# Patient Record
Sex: Female | Born: 1940 | Race: White | Hispanic: No | Marital: Married | Smoking: Never smoker
Health system: Southern US, Community
[De-identification: ages and names within clinical notes are randomized; demographics above are authoritative.]

## PROBLEM LIST (undated history)

## (undated) DIAGNOSIS — Z860100 Personal history of colon polyps, unspecified: Secondary | ICD-10-CM

## (undated) DIAGNOSIS — F0390 Unspecified dementia without behavioral disturbance: Secondary | ICD-10-CM

## (undated) DIAGNOSIS — K219 Gastro-esophageal reflux disease without esophagitis: Secondary | ICD-10-CM

## (undated) DIAGNOSIS — C541 Malignant neoplasm of endometrium: Secondary | ICD-10-CM

## (undated) DIAGNOSIS — Z889 Allergy status to unspecified drugs, medicaments and biological substances status: Secondary | ICD-10-CM

## (undated) DIAGNOSIS — F419 Anxiety disorder, unspecified: Secondary | ICD-10-CM

## (undated) DIAGNOSIS — F32A Depression, unspecified: Secondary | ICD-10-CM

## (undated) HISTORY — PX: ABDOMINAL HYSTERECTOMY: SHX81

---

## 2001-03-12 HISTORY — PX: COLONOSCOPY: SHX174

## 2005-09-22 ENCOUNTER — Other Ambulatory Visit: Payer: Self-pay

## 2005-09-22 ENCOUNTER — Ambulatory Visit: Payer: Self-pay | Admitting: Urology

## 2007-02-01 ENCOUNTER — Ambulatory Visit: Payer: Self-pay | Admitting: Family Medicine

## 2008-11-22 ENCOUNTER — Emergency Department: Payer: Self-pay | Admitting: Emergency Medicine

## 2008-12-24 ENCOUNTER — Ambulatory Visit: Payer: Self-pay | Admitting: Family Medicine

## 2010-03-01 ENCOUNTER — Ambulatory Visit: Payer: Self-pay | Admitting: Surgery

## 2010-03-08 ENCOUNTER — Ambulatory Visit: Payer: Self-pay | Admitting: Surgery

## 2010-10-21 ENCOUNTER — Ambulatory Visit: Payer: Self-pay | Admitting: Family Medicine

## 2010-11-25 ENCOUNTER — Ambulatory Visit: Payer: Self-pay | Admitting: Family Medicine

## 2011-04-12 ENCOUNTER — Ambulatory Visit: Payer: Self-pay | Admitting: Unknown Physician Specialty

## 2011-04-12 HISTORY — PX: COLONOSCOPY: SHX174

## 2011-04-12 HISTORY — PX: ESOPHAGOGASTRODUODENOSCOPY: SHX1529

## 2011-05-05 ENCOUNTER — Ambulatory Visit: Payer: Self-pay | Admitting: Family Medicine

## 2011-05-13 HISTORY — PX: COLONOSCOPY: SHX174

## 2011-10-25 ENCOUNTER — Ambulatory Visit: Payer: Self-pay | Admitting: Family Medicine

## 2011-11-09 HISTORY — PX: COLONOSCOPY: SHX174

## 2012-03-09 ENCOUNTER — Ambulatory Visit: Payer: Self-pay | Admitting: Internal Medicine

## 2012-09-11 ENCOUNTER — Ambulatory Visit: Payer: Self-pay | Admitting: Otolaryngology

## 2012-09-11 LAB — CREATININE, SERUM
Creatinine: 0.83 mg/dL (ref 0.60–1.30)
EGFR (Non-African Amer.): >60

## 2012-10-25 ENCOUNTER — Ambulatory Visit: Payer: Self-pay | Admitting: Internal Medicine

## 2013-10-28 ENCOUNTER — Ambulatory Visit: Payer: Self-pay | Admitting: Internal Medicine

## 2013-11-20 ENCOUNTER — Ambulatory Visit: Payer: Self-pay | Admitting: Ophthalmology

## 2014-01-01 ENCOUNTER — Ambulatory Visit: Payer: Self-pay | Admitting: Ophthalmology

## 2014-05-28 ENCOUNTER — Encounter: Payer: Self-pay | Admitting: Podiatry

## 2014-05-28 ENCOUNTER — Ambulatory Visit (INDEPENDENT_AMBULATORY_CARE_PROVIDER_SITE_OTHER): Payer: Medicare Other

## 2014-05-28 ENCOUNTER — Ambulatory Visit (INDEPENDENT_AMBULATORY_CARE_PROVIDER_SITE_OTHER): Payer: Medicare Other | Admitting: Podiatry

## 2014-05-28 VITALS — BP 122/75 | HR 72 | Resp 16 | Ht 60.0 in | Wt 128.0 lb

## 2014-05-28 DIAGNOSIS — G629 Polyneuropathy, unspecified: Secondary | ICD-10-CM

## 2014-05-28 DIAGNOSIS — D519 Vitamin B12 deficiency anemia, unspecified: Secondary | ICD-10-CM

## 2014-05-28 NOTE — Progress Notes (Signed)
   Subjective:    Patient ID: Laurie Long, female    DOB: 12-03-1940, 74 y.o.   MRN: [id number]  HPI Comments: Both of my feet into my knees burn and sting. My right instep has fallen. My 2nd  toe on my left foot cramps so bad. It will lay over my big toe when its cramping. This all started 6 months ago. Its gotten worse. It hurts when i walk. i soak in epsom salt, i sit them on a heating pad  Foot Pain Associated symptoms include a sore throat.      Review of Systems  HENT: Positive for sore throat and trouble swallowing.        Sinus problems   Cardiovascular: Positive for leg swelling.       Calf pain  Gastrointestinal: Positive for constipation.  Musculoskeletal: Positive for back pain.       Joint pain Difficulty walking  Neurological: Positive for dizziness and light-headedness.  All other systems reviewed and are negative.      Objective:   Physical Exam: I have reviewed her past medical history medications allergies surgery social history and review of systems. Ulcers are strongly palpable bilateral. Neurologic sensorium is intact per Semmes-Weinstein monofilament. Deep tendon reflexes are intact bilaterally muscle strength +5 over 5 dorsiflexion plantar flexors and inverters everters all intrinsic musculature is intact orthopedic evaluation demonstrates all joints distal to the ankle for range of motion without crepitation.        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  Plan: Increase B-12 and follow-up with me in 1 month I did put her on Nuerx and we will reevaluate her at that time and perform blood work if necessary.

## 2014-06-25 ENCOUNTER — Ambulatory Visit (INDEPENDENT_AMBULATORY_CARE_PROVIDER_SITE_OTHER): Payer: Medicare Other | Admitting: Podiatry

## 2014-06-25 ENCOUNTER — Encounter: Payer: Self-pay | Admitting: Podiatry

## 2014-06-25 DIAGNOSIS — G629 Polyneuropathy, unspecified: Secondary | ICD-10-CM | POA: Diagnosis not present

## 2014-06-25 DIAGNOSIS — D519 Vitamin B12 deficiency anemia, unspecified: Secondary | ICD-10-CM

## 2014-06-25 NOTE — Progress Notes (Signed)
She presents today states that she is approximately 98% improved. She's been taking neurax and states that it has helped considerably. She no longer has pain radiating from her feet through her legs and knees.  Objective: Pulses are palpable otherwise no change in physical exam other than no neuropathic findings.  Assessment: Neuropathy being treated with neurax.  Plan: Continue with his oral medication. Follow up with me as needed

## 2014-11-18 ENCOUNTER — Other Ambulatory Visit: Payer: Self-pay | Admitting: Internal Medicine

## 2014-11-18 DIAGNOSIS — Z1231 Encounter for screening mammogram for malignant neoplasm of breast: Secondary | ICD-10-CM

## 2014-11-19 ENCOUNTER — Ambulatory Visit
Admission: RE | Admit: 2014-11-19 | Discharge: 2014-11-19 | Disposition: A | Discharge status: Home / Self Care | Payer: Medicare Other | Source: Ambulatory Visit | Attending: Internal Medicine | Admitting: Internal Medicine

## 2014-11-19 ENCOUNTER — Other Ambulatory Visit: Payer: Self-pay | Admitting: Internal Medicine

## 2014-11-19 DIAGNOSIS — Z1231 Encounter for screening mammogram for malignant neoplasm of breast: Secondary | ICD-10-CM

## 2014-12-23 ENCOUNTER — Encounter: Payer: Self-pay | Admitting: *Deleted

## 2014-12-24 ENCOUNTER — Ambulatory Visit: Payer: Medicare Other | Admitting: Anesthesiology

## 2014-12-24 ENCOUNTER — Encounter: Payer: Self-pay | Admitting: *Deleted

## 2014-12-24 ENCOUNTER — Encounter
Admission: RE | Disposition: A | Discharge status: Home / Self Care | Payer: Self-pay | Source: Ambulatory Visit | Attending: Unknown Physician Specialty

## 2014-12-24 ENCOUNTER — Ambulatory Visit
Admission: RE | Admit: 2014-12-24 | Discharge: 2014-12-24 | Disposition: A | Discharge status: Home / Self Care | Payer: Medicare Other | Source: Ambulatory Visit | Attending: Unknown Physician Specialty | Admitting: Unknown Physician Specialty

## 2014-12-24 DIAGNOSIS — K621 Rectal polyp: Secondary | ICD-10-CM | POA: Insufficient documentation

## 2014-12-24 DIAGNOSIS — D122 Benign neoplasm of ascending colon: Secondary | ICD-10-CM | POA: Insufficient documentation

## 2014-12-24 DIAGNOSIS — D123 Benign neoplasm of transverse colon: Secondary | ICD-10-CM | POA: Insufficient documentation

## 2014-12-24 DIAGNOSIS — Z7982 Long term (current) use of aspirin: Secondary | ICD-10-CM | POA: Diagnosis not present

## 2014-12-24 DIAGNOSIS — Z79899 Other long term (current) drug therapy: Secondary | ICD-10-CM | POA: Diagnosis not present

## 2014-12-24 DIAGNOSIS — F419 Anxiety disorder, unspecified: Secondary | ICD-10-CM | POA: Insufficient documentation

## 2014-12-24 DIAGNOSIS — Z885 Allergy status to narcotic agent status: Secondary | ICD-10-CM | POA: Insufficient documentation

## 2014-12-24 DIAGNOSIS — K219 Gastro-esophageal reflux disease without esophagitis: Secondary | ICD-10-CM | POA: Insufficient documentation

## 2014-12-24 DIAGNOSIS — Z86010 Personal history of colonic polyps: Secondary | ICD-10-CM | POA: Diagnosis present

## 2014-12-24 DIAGNOSIS — R42 Dizziness and giddiness: Secondary | ICD-10-CM | POA: Diagnosis not present

## 2014-12-24 HISTORY — PX: COLONOSCOPY WITH PROPOFOL: SHX5780

## 2014-12-24 HISTORY — DX: Personal history of colon polyps, unspecified: Z86.0100

## 2014-12-24 HISTORY — DX: Gastro-esophageal reflux disease without esophagitis: K21.9

## 2014-12-24 HISTORY — DX: Personal history of colonic polyps: Z86.010

## 2014-12-24 HISTORY — DX: Anxiety disorder, unspecified: F41.9

## 2014-12-24 SURGERY — COLONOSCOPY WITH PROPOFOL
Anesthesia: General

## 2014-12-24 MED ORDER — PROPOFOL INFUSION 10 MG/ML OPTIME
INTRAVENOUS | Status: DC | PRN
  Administered 2014-12-24: 100 ug/kg/min via INTRAVENOUS

## 2014-12-24 MED ORDER — PROPOFOL 10 MG/ML IV BOLUS
INTRAVENOUS | Status: DC | PRN
  Administered 2014-12-24: 30 mg via INTRAVENOUS

## 2014-12-24 MED ORDER — SODIUM CHLORIDE 0.9 % IV SOLN: INTRAVENOUS | Status: DC

## 2014-12-24 MED ORDER — SODIUM CHLORIDE 0.9 % IV SOLN
INTRAVENOUS | Status: DC
  Administered 2014-12-24: 1000 mL via INTRAVENOUS
  Administered 2014-12-24: 08:00:00 via INTRAVENOUS

## 2014-12-24 NOTE — Anesthesia Preprocedure Evaluation (Addendum)
Anesthesia Evaluation  Patient identified by MRN, date of birth, ID band Patient awake    Reviewed: Allergy & Precautions, NPO status , Patient's Chart, lab work & pertinent test results  Airway Mallampati: II  TM Distance: >3 FB     Dental  (+) Chipped   Pulmonary neg pulmonary ROS,    Pulmonary exam normal       Cardiovascular negative cardio ROS Normal cardiovascular exam    Neuro/Psych Anxiety negative neurological ROS     GI/Hepatic Neg liver ROS, GERD-  Medicated and Controlled,Colon polyps   Endo/Other  negative endocrine ROS  Renal/GU negative Renal ROS  negative genitourinary   Musculoskeletal negative musculoskeletal ROS (+)   Abdominal Normal abdominal exam  (+)   Peds negative pediatric ROS (+)  Hematology negative hematology ROS (+)   Anesthesia Other Findings   Reproductive/Obstetrics                            Anesthesia Physical Anesthesia Plan  ASA: II  Anesthesia Plan: General   Post-op Pain Management:    Induction: Intravenous  Airway Management Planned: Nasal Cannula  Additional Equipment:   Intra-op Plan:   Post-operative Plan:   Informed Consent: I have reviewed the patients History and Physical, chart, labs and discussed the procedure including the risks, benefits and alternatives for the proposed anesthesia with the patient or authorized representative who has indicated his/her understanding and acceptance.   Dental advisory given  Plan Discussed with: CRNA and Surgeon  Anesthesia Plan Comments:         Anesthesia Quick Evaluation

## 2014-12-24 NOTE — Anesthesia Postprocedure Evaluation (Signed)
  Anesthesia Post-op Note  Patient: Laurie Long  Procedure(s) Performed: Procedure(s): COLONOSCOPY WITH PROPOFOL (N/A)  Anesthesia type:General  Patient location: PACU  Post pain: Pain level controlled  Post assessment: Post-op Vital signs reviewed, Patient's Cardiovascular Status Stable, Respiratory Function Stable, Patent Airway and No signs of Nausea or vomiting  Post vital signs: Reviewed and stable  Last Vitals:  Filed Vitals:   12/24/14 0906  BP: 131/69  Pulse: 58  Temp:   Resp: 15    Level of consciousness: awake, alert  and patient cooperative  Complications: No apparent anesthesia complications

## 2014-12-24 NOTE — H&P (Signed)
Primary Care Physician:  Tracie Harrier, MD Primary Gastroenterologist:  Dr. Vira Agar  Pre-Procedure History & Physical: HPI:  Laurie Long is a 74 y.o. female is here for an colonoscopy.   Past Medical History  Diagnosis Date  . Anxiety   . GERD (gastroesophageal reflux disease)   . History of colonic polyps     Past Surgical History  Procedure Laterality Date  . Abdominal hysterectomy      Prior to Admission medications   Medication Sig Start Date End Date Taking? Authorizing Provider  ALPRAZolam Duanne Moron) 0.5 MG tablet Take 0.5 mg by mouth at bedtime as needed for anxiety.   Yes Historical Provider, MD  gabapentin (NEURONTIN) 100 MG capsule Take 100 mg by mouth at bedtime.   Yes Historical Provider, MD  levocetirizine (XYZAL) 5 MG tablet Take 5 mg by mouth every evening.   Yes Historical Provider, MD  meclizine (ANTIVERT) 25 MG tablet Take 25 mg by mouth 3 (three) times daily as needed for dizziness.   Yes Historical Provider, MD  Ranitidine HCl (ZANTAC PO) Take by mouth daily.   Yes Historical Provider, MD  vitamin B-12 (CYANOCOBALAMIN) 1000 MCG tablet Take 1,000 mcg by mouth daily.   Yes Historical Provider, MD  aspirin 81 MG tablet Take 81 mg by mouth daily.    Historical Provider, MD    Allergies as of 10/29/2014 - Review Complete 06/25/2014  Allergen Reaction Noted  . Codeine Other (See Comments) 05/28/2014    History reviewed. No pertinent family history.  Social History   Social History  . Marital Status: Married    Spouse Name: N/A  . Number of Children: N/A  . Years of Education: N/A   Occupational History  . Not on file.   Social History Main Topics  . Smoking status: Never Smoker   . Smokeless tobacco: Not on file  . Alcohol Use: No  . Drug Use: No  . Sexual Activity: Not on file   Other Topics Concern  . Not on file   Social History Narrative    Review of Systems: See HPI, otherwise negative ROS  Physical Exam: Pulse 60  Temp(Src)  98.6 F (37 C) (Oral)  Resp 20  Ht 5\' 1"  (1.549 m)  Wt 54.432 kg (120 lb)  BMI 22.69 kg/m2  SpO2 100% General:   Alert,  pleasant and cooperative in NAD Head:  Normocephalic and atraumatic. Neck:  Supple; no masses or thyromegaly. Lungs:  Clear throughout to auscultation.    Heart:  Regular rate and rhythm. Abdomen:  Soft, nontender and nondistended. Normal bowel sounds, without guarding, and without rebound.   Neurologic:  Alert and  oriented x4;  grossly normal neurologically.  Impression/Plan: Laurie Long is here for an colonoscopy to be performed for [facility] colon polyps  Risks, benefits, limitations, and alternatives regarding  colonoscopy have been reviewed with the patient.  Questions have been answered.  All parties agreeable.   Gaylyn Cheers, MD  12/24/2014, 8:01 AM    Primary Care Physician:  Tracie Harrier, MD Primary Gastroenterologist:  Dr. Vira Agar  Pre-Procedure History & Physical: HPI:  Laurie Long is a 74 y.o. female is here for an colonoscopy.   Past Medical History  Diagnosis Date  . Anxiety   . GERD (gastroesophageal reflux disease)   . History of colonic polyps     Past Surgical History  Procedure Laterality Date  . Abdominal hysterectomy      Prior to Admission medications   Medication Sig Start Date  End Date Taking? Authorizing Provider  ALPRAZolam Duanne Moron) 0.5 MG tablet Take 0.5 mg by mouth at bedtime as needed for anxiety.   Yes Historical Provider, MD  gabapentin (NEURONTIN) 100 MG capsule Take 100 mg by mouth at bedtime.   Yes Historical Provider, MD  levocetirizine (XYZAL) 5 MG tablet Take 5 mg by mouth every evening.   Yes Historical Provider, MD  meclizine (ANTIVERT) 25 MG tablet Take 25 mg by mouth 3 (three) times daily as needed for dizziness.   Yes Historical Provider, MD  Ranitidine HCl (ZANTAC PO) Take by mouth daily.   Yes Historical Provider, MD  vitamin B-12 (CYANOCOBALAMIN) 1000 MCG tablet Take 1,000 mcg by mouth daily.    Yes Historical Provider, MD  aspirin 81 MG tablet Take 81 mg by mouth daily.    Historical Provider, MD    Allergies as of 10/29/2014 - Review Complete 06/25/2014  Allergen Reaction Noted  . Codeine Other (See Comments) 05/28/2014    History reviewed. No pertinent family history.  Social History   Social History  . Marital Status: Married    Spouse Name: N/A  . Number of Children: N/A  . Years of Education: N/A   Occupational History  . Not on file.   Social History Main Topics  . Smoking status: Never Smoker   . Smokeless tobacco: Not on file  . Alcohol Use: No  . Drug Use: No  . Sexual Activity: Not on file   Other Topics Concern  . Not on file   Social History Narrative    Review of Systems: See HPI, otherwise negative ROS  Physical Exam: Pulse 60  Temp(Src) 98.6 F (37 C) (Oral)  Resp 20  Ht 5\' 1"  (1.549 m)  Wt 54.432 kg (120 lb)  BMI 22.69 kg/m2  SpO2 100% General:   Alert,  pleasant and cooperative in NAD Head:  Normocephalic and atraumatic. Neck:  Supple; no masses or thyromegaly. Lungs:  Clear throughout to auscultation.    Heart:  Regular rate and rhythm. Abdomen:  Soft, nontender and nondistended. Normal bowel sounds, without guarding, and without rebound.   Neurologic:  Alert and  oriented x4;  grossly normal neurologically.  Impression/Plan: Laurie Long is here for an colonoscopy to be performed for [facility] colon polyps  Risks, benefits, limitations, and alternatives regarding  colonoscopy have been reviewed with the patient.  Questions have been answered.  All parties agreeable.   Gaylyn Cheers, MD  12/24/2014, 8:01 AM

## 2014-12-24 NOTE — Transfer of Care (Signed)
Immediate Anesthesia Transfer of Care Note  Patient: Laurie Long  Procedure(s) Performed: Procedure(s): COLONOSCOPY WITH PROPOFOL (N/A)  Patient Location: PACU  Anesthesia Type:General  Level of Consciousness: sedated  Airway & Oxygen Therapy: Patient Spontanous Breathing  Post-op Assessment: Report given to RN and Post -op Vital signs reviewed and stable  Post vital signs: Reviewed and stable  Last Vitals:  Filed Vitals:   12/24/14 0724  Pulse: 60  Temp: 37 C  Resp: 20    Complications: No apparent anesthesia complications

## 2014-12-25 ENCOUNTER — Encounter: Payer: Self-pay | Admitting: Unknown Physician Specialty

## 2014-12-25 LAB — SURGICAL PATHOLOGY

## 2014-12-25 NOTE — Op Note (Signed)
[facility] Gastroenterology Patient Name: Laurie Long Procedure Date: 12/24/2014 7:28 AM MRN: [id number] Account #: [id number] Date of Birth: 04-22-1941 Admit Type: Outpatient Age: 74 Room: [facility] ENDO ROOM 3 Gender: Female Note Status: Finalized Procedure:         Colonoscopy Indications:       High risk colon cancer surveillance: Personal history of                     colonic polyps Providers:         Manya Silvas, MD Referring MD:      Tracie Harrier, MD (Referring MD) Medicines:         Propofol per Anesthesia Complications:     No immediate complications. Procedure:         Pre-Anesthesia Assessment:                    - After reviewing the risks and benefits, the patient was                     deemed in satisfactory condition to undergo the procedure.                    After obtaining informed consent, the colonoscope was                     passed under direct vision. Throughout the procedure, the                     patient's blood pressure, pulse, and oxygen saturations                     were monitored continuously. The Colonoscope was                     introduced through the anus and advanced to the the cecum,                     identified by appendiceal orifice and ileocecal valve. The                     colonoscopy was performed without difficulty. The patient                     tolerated the procedure well. The quality of the bowel                     preparation was excellent. Findings:      A diminutive polyp was found in the transverse colon. The polyp was       sessile. The polyp was removed with a jumbo cold forceps. Resection and       retrieval were complete.      A diminutive polyp was found in the ascending colon. The polyp was       sessile. The polyp was removed with a jumbo cold forceps. Resection and       retrieval were complete.      A diminutive polyp was found in the rectum. The polyp was sessile. The       polyp  was removed with a jumbo cold forceps. Resection and retrieval       were complete.      Clips from previous hemorrhoid surgery seen.      The exam was otherwise without abnormality. Impression:        -  One diminutive polyp in the transverse colon. Resected                     and retrieved.                    - One diminutive polyp in the ascending colon. Resected                     and retrieved.                    - One diminutive polyp in the rectum. Resected and                     retrieved. Recommendation:    - Await pathology results. Manya Silvas, MD 12/24/2014 8:32:40 AM This report has been signed electronically. Number of Addenda: 0 Note Initiated On: 12/24/2014 7:28 AM Scope Withdrawal Time: 0 hours 11 minutes 5 seconds  Total Procedure Duration: 0 hours 18 minutes 5 seconds       [facility]

## 2015-06-22 ENCOUNTER — Other Ambulatory Visit: Payer: Self-pay | Admitting: Internal Medicine

## 2015-06-22 DIAGNOSIS — Z1231 Encounter for screening mammogram for malignant neoplasm of breast: Secondary | ICD-10-CM

## 2015-06-22 DIAGNOSIS — R1011 Right upper quadrant pain: Secondary | ICD-10-CM

## 2015-06-29 ENCOUNTER — Ambulatory Visit
Admission: RE | Admit: 2015-06-29 | Discharge: 2015-06-29 | Disposition: A | Discharge status: Home / Self Care | Payer: Medicare Other | Source: Ambulatory Visit | Attending: Internal Medicine | Admitting: Internal Medicine

## 2015-06-29 DIAGNOSIS — R1011 Right upper quadrant pain: Secondary | ICD-10-CM

## 2015-07-06 ENCOUNTER — Other Ambulatory Visit: Payer: Self-pay | Admitting: Internal Medicine

## 2015-07-06 DIAGNOSIS — R1011 Right upper quadrant pain: Secondary | ICD-10-CM

## 2015-07-15 ENCOUNTER — Ambulatory Visit
Admission: RE | Admit: 2015-07-15 | Discharge: 2015-07-15 | Disposition: A | Discharge status: Home / Self Care | Payer: Medicare Other | Source: Ambulatory Visit | Attending: Internal Medicine | Admitting: Internal Medicine

## 2015-07-15 DIAGNOSIS — R932 Abnormal findings on diagnostic imaging of liver and biliary tract: Secondary | ICD-10-CM | POA: Diagnosis not present

## 2015-07-15 DIAGNOSIS — R1011 Right upper quadrant pain: Secondary | ICD-10-CM | POA: Diagnosis present

## 2015-07-15 DIAGNOSIS — R197 Diarrhea, unspecified: Secondary | ICD-10-CM | POA: Insufficient documentation

## 2015-07-15 MED ORDER — IOHEXOL 300 MG/ML  SOLN
100.0000 mL | Freq: Once | INTRAMUSCULAR | Status: AC | PRN
  Administered 2015-07-15: 100 mL via INTRAVENOUS

## 2015-07-16 ENCOUNTER — Other Ambulatory Visit: Payer: Self-pay | Admitting: Physician Assistant

## 2015-07-16 ENCOUNTER — Ambulatory Visit
Admission: RE | Admit: 2015-07-16 | Discharge: 2015-07-16 | Disposition: A | Discharge status: Home / Self Care | Payer: Medicare Other | Source: Ambulatory Visit | Attending: Physician Assistant | Admitting: Physician Assistant

## 2015-07-16 DIAGNOSIS — M7989 Other specified soft tissue disorders: Secondary | ICD-10-CM

## 2015-07-16 DIAGNOSIS — M79604 Pain in right leg: Secondary | ICD-10-CM | POA: Insufficient documentation

## 2015-11-20 ENCOUNTER — Other Ambulatory Visit: Payer: Self-pay | Admitting: Internal Medicine

## 2015-11-20 ENCOUNTER — Ambulatory Visit
Admission: RE | Admit: 2015-11-20 | Discharge: 2015-11-20 | Disposition: A | Discharge status: Home / Self Care | Payer: Medicare Other | Source: Ambulatory Visit | Attending: Internal Medicine | Admitting: Internal Medicine

## 2015-11-20 DIAGNOSIS — Z1231 Encounter for screening mammogram for malignant neoplasm of breast: Secondary | ICD-10-CM

## 2016-10-12 ENCOUNTER — Other Ambulatory Visit: Payer: Self-pay | Admitting: Internal Medicine

## 2016-10-12 DIAGNOSIS — Z1231 Encounter for screening mammogram for malignant neoplasm of breast: Secondary | ICD-10-CM

## 2016-11-22 ENCOUNTER — Ambulatory Visit
Admission: RE | Admit: 2016-11-22 | Discharge: 2016-11-22 | Disposition: A | Discharge status: Home / Self Care | Payer: Medicare Other | Source: Ambulatory Visit | Attending: Internal Medicine | Admitting: Internal Medicine

## 2016-11-22 DIAGNOSIS — Z1231 Encounter for screening mammogram for malignant neoplasm of breast: Secondary | ICD-10-CM | POA: Insufficient documentation

## 2016-11-24 IMAGING — CT CT ABD-PELV W/ CM
1 of 3 series · 14 of 32 positions shown, 19 images · IV contrast (omnipaque)
Comparison: Right upper quadrant ultrasound 06/29/2015. CT abdomen
and pelvis 09/22/2005.

CLINICAL DATA: Right upper quadrant abdominal pain with diarrhea
for 1 month.

EXAM:
CT ABDOMEN AND PELVIS WITH CONTRAST
TECHNIQUE: Multidetector CT imaging of the abdomen and pelvis was performed
using the standard protocol following bolus administration of
intravenous contrast.
CONTRAST:  100mL OMNIPAQUE IOHEXOL 300 MG/ML  SOLN

[Series 2: routine abd pel with · axial · 0.69mm/px · z∈[-470,-70]mm · 14 of 91 slices shown, 19 images]
[im 6/91  soft-tissue]
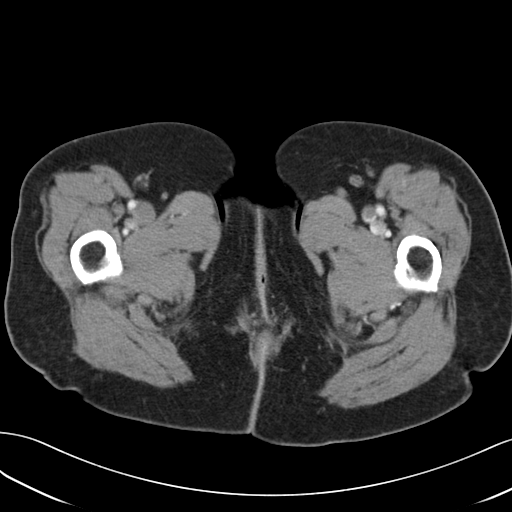
[im 6/91  bone]
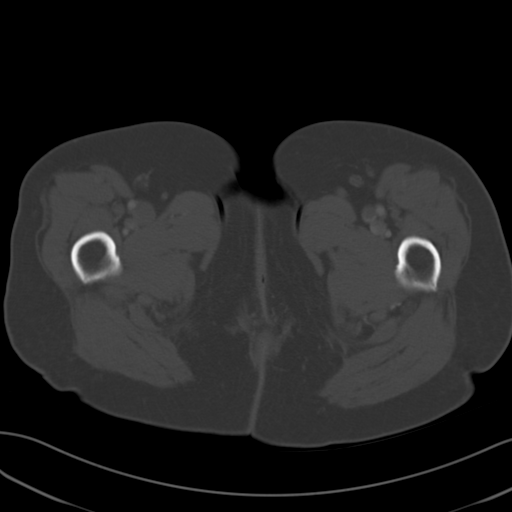
[im 11/91  soft-tissue]
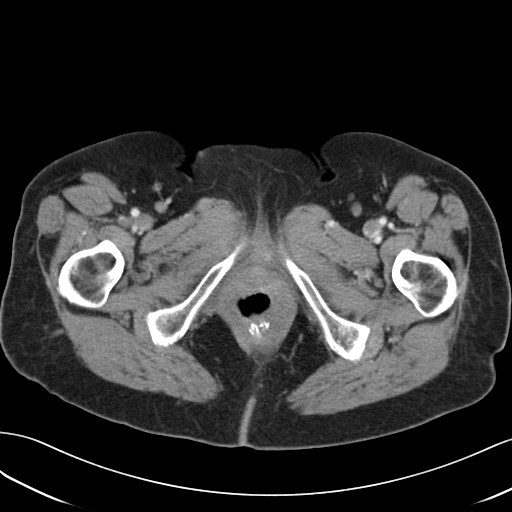
[im 21/91  soft-tissue]
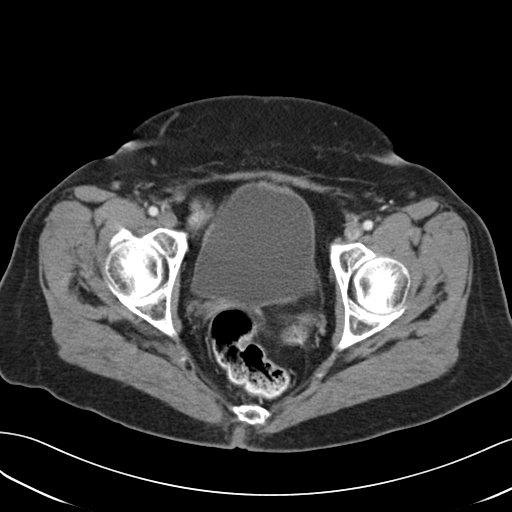
[im 26/91  soft-tissue]
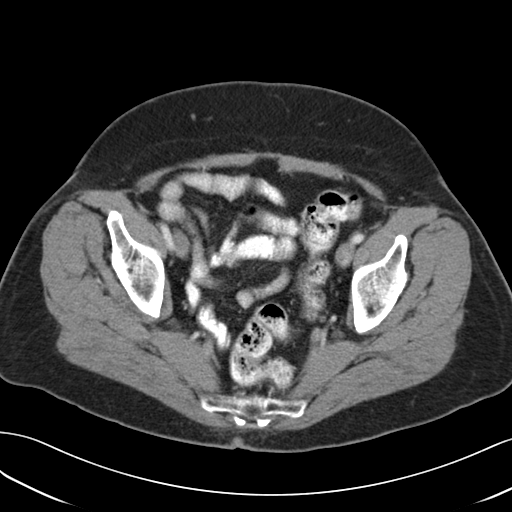
[im 31/91  soft-tissue]
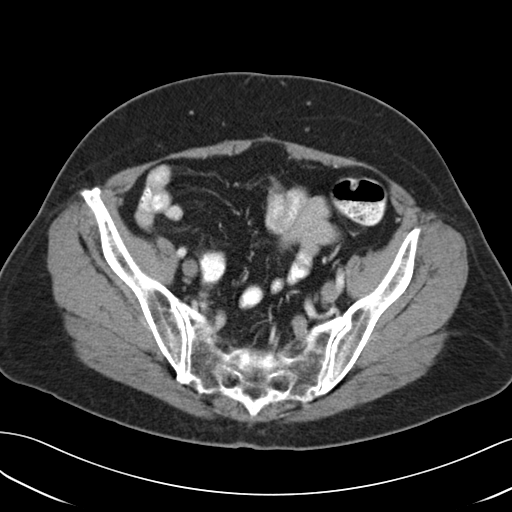
[im 41/91  soft-tissue]
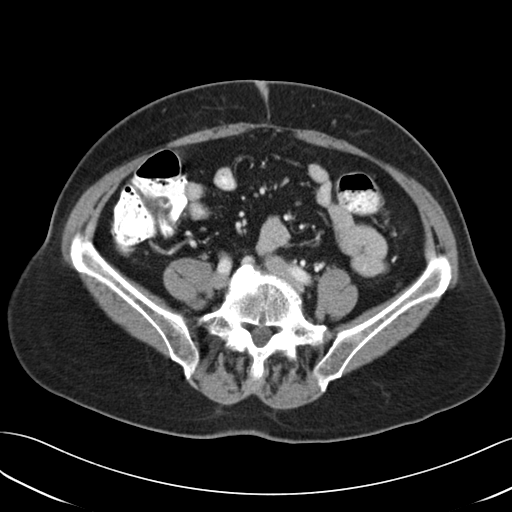
[im 46/91  soft-tissue]
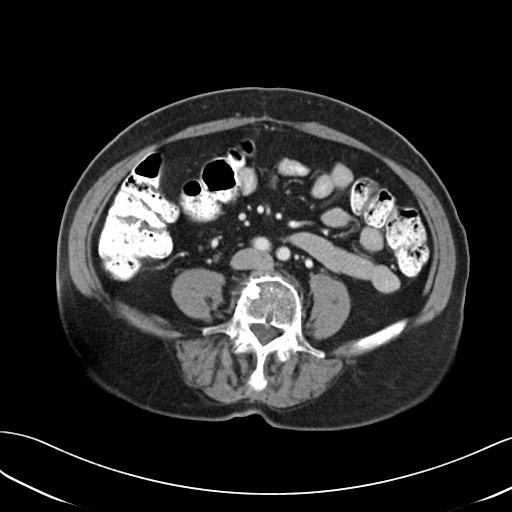
[im 51/91  soft-tissue]
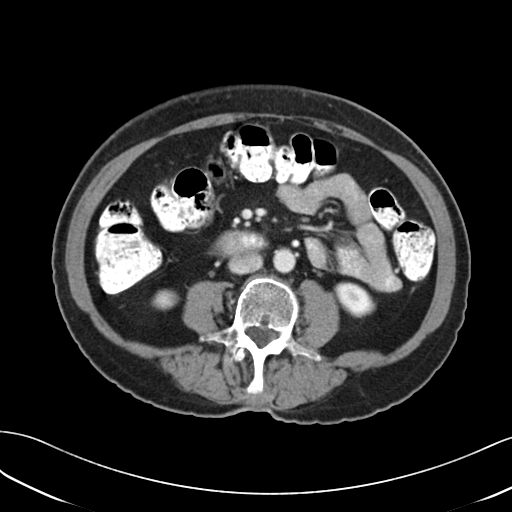
[im 61/91  soft-tissue]
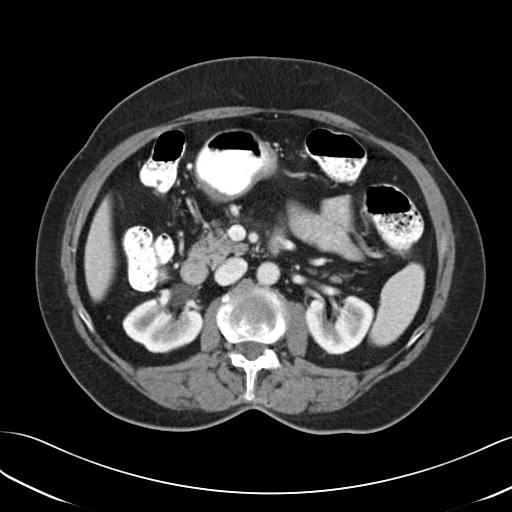
[im 61/91  bone]
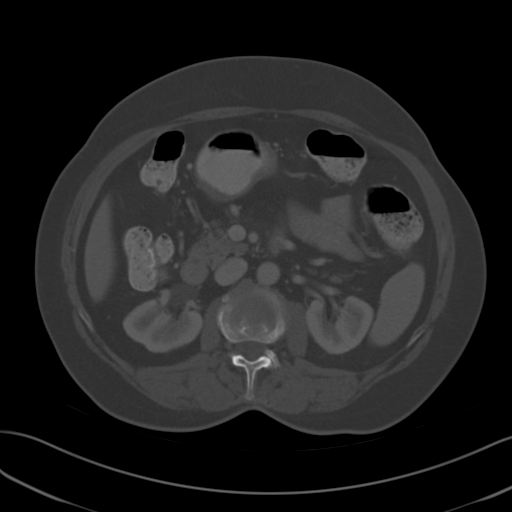
[im 66/91  soft-tissue]
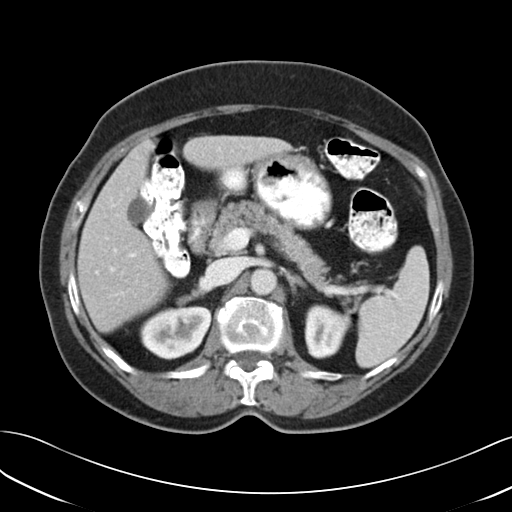
[im 71/91  soft-tissue]
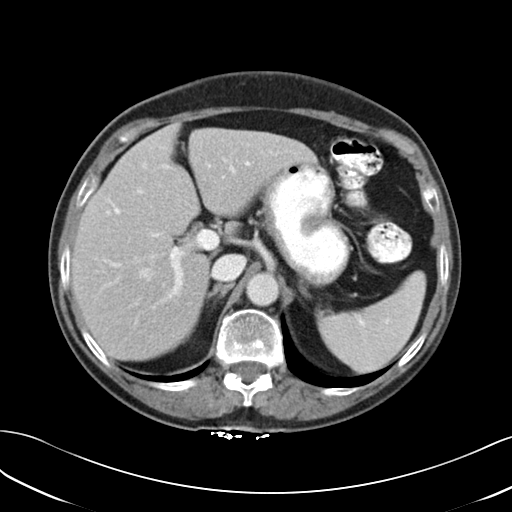
[im 71/91  lung]
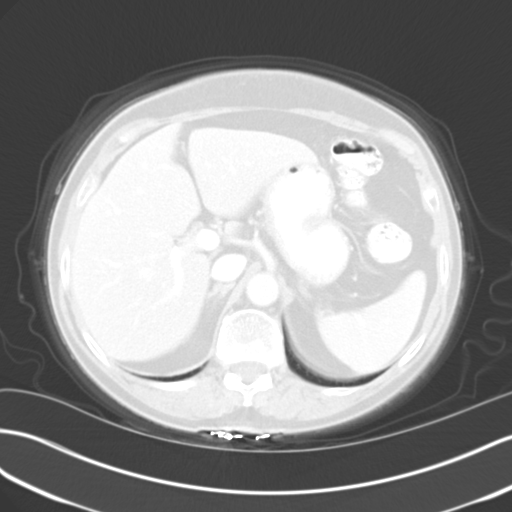
[im 76/91  lung]
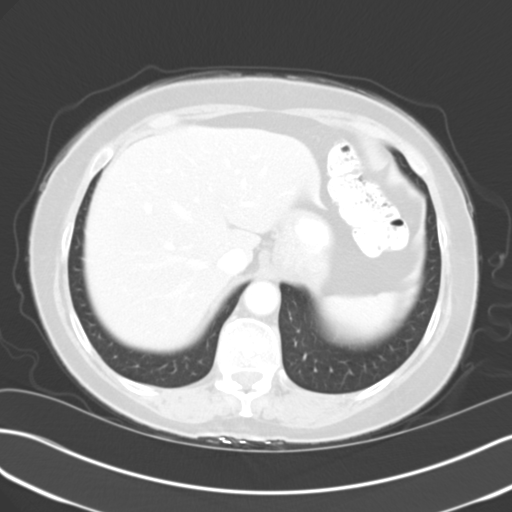
[im 81/91  soft-tissue]
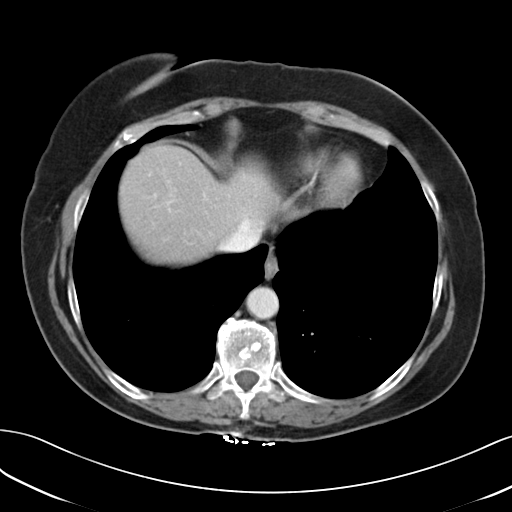
[im 81/91  lung]
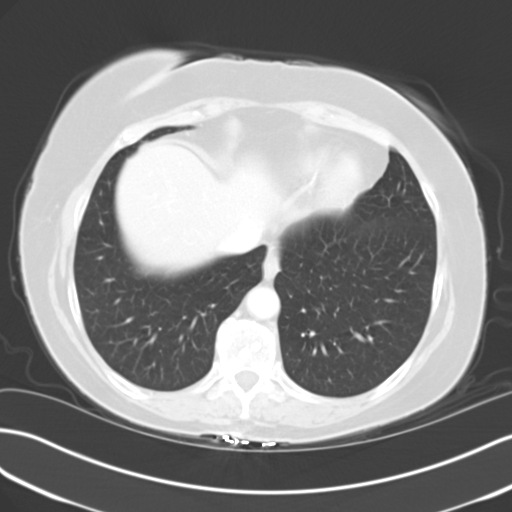
[im 86/91  soft-tissue]
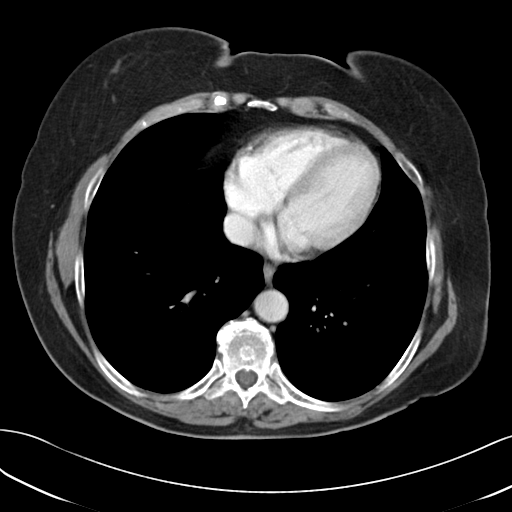
[im 86/91  lung]
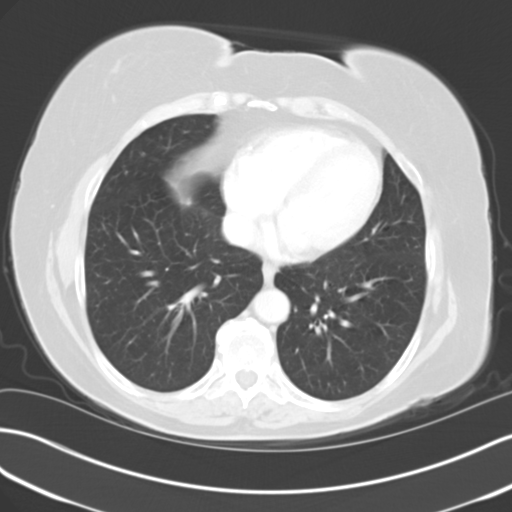

[14 of 32 positions shown; findings below may reference images not displayed]

FINDINGS: 2-3 mm left lower lobe nodule is likely unchanged allowing for
differences in slice thickness (series 4, image 12). The visualized
lung bases are clear of consolidation or effusion.

Diffusely decreased attenuation of the liver may reflect mild
steatosis. No focal liver lesion is identified. There is no biliary
dilatation. The gallbladder, spleen, adrenal glands, and pancreas
are unremarkable. Extrarenal pelves are noted bilaterally. No renal
mass, calculi, or hydronephrosis is seen.

Oral contrast is present in nondilated loops of colon and distal
small bowel. There is no evidence of bowel obstruction or
inflammation. The appendix is surgically absent.

No free fluid or enlarged lymph nodes are identified. The bladder is
unremarkable. The uterus is absent. The ovaries are not identified
and may also be absent. Thoracolumbar spondylosis is noted. A
subcentimeter sclerotic focus is again seen in the L4 vertebral
body, possibly a bone island.
IMPRESSION: 1. No acute abnormality identified in the abdomen or pelvis.
2. Suspected mild hepatic steatosis.

## 2017-04-21 ENCOUNTER — Emergency Department
Admission: EM | Admit: 2017-04-21 | Discharge: 2017-04-22 | Disposition: A | Discharge status: Home / Self Care | Payer: Medicare Other | Attending: Emergency Medicine | Admitting: Emergency Medicine

## 2017-04-21 ENCOUNTER — Other Ambulatory Visit: Payer: Self-pay

## 2017-04-21 ENCOUNTER — Emergency Department: Payer: Medicare Other

## 2017-04-21 ENCOUNTER — Encounter: Payer: Self-pay | Admitting: Emergency Medicine

## 2017-04-21 DIAGNOSIS — S90112A Contusion of left great toe without damage to nail, initial encounter: Secondary | ICD-10-CM | POA: Insufficient documentation

## 2017-04-21 DIAGNOSIS — Z7982 Long term (current) use of aspirin: Secondary | ICD-10-CM | POA: Diagnosis not present

## 2017-04-21 DIAGNOSIS — Y939 Activity, unspecified: Secondary | ICD-10-CM | POA: Insufficient documentation

## 2017-04-21 DIAGNOSIS — R55 Syncope and collapse: Secondary | ICD-10-CM

## 2017-04-21 DIAGNOSIS — N390 Urinary tract infection, site not specified: Secondary | ICD-10-CM

## 2017-04-21 DIAGNOSIS — W208XXA Other cause of strike by thrown, projected or falling object, initial encounter: Secondary | ICD-10-CM | POA: Diagnosis not present

## 2017-04-21 DIAGNOSIS — Y999 Unspecified external cause status: Secondary | ICD-10-CM | POA: Diagnosis not present

## 2017-04-21 DIAGNOSIS — Z79899 Other long term (current) drug therapy: Secondary | ICD-10-CM | POA: Diagnosis not present

## 2017-04-21 DIAGNOSIS — Y92009 Unspecified place in unspecified non-institutional (private) residence as the place of occurrence of the external cause: Secondary | ICD-10-CM | POA: Diagnosis not present

## 2017-04-21 LAB — CBC
HEMATOCRIT: 38.8 % (ref 35.0–47.0)
Hemoglobin: 12.9 g/dL (ref 12.0–16.0)
MCH: 30.1 pg (ref 26.0–34.0)
MCHC: 33.2 g/dL (ref 32.0–36.0)
MCV: 90.6 fL (ref 80.0–100.0)
Platelets: 190 10*3/uL (ref 150–440)
RBC: 4.28 MIL/uL (ref 3.80–5.20)
RDW: 13.1 % (ref 11.5–14.5)
WBC: 9.2 10*3/uL (ref 3.6–11.0)

## 2017-04-21 LAB — COMPREHENSIVE METABOLIC PANEL
ALK PHOS: 76 U/L (ref 38–126)
ALT: 10 U/L — AB (ref 14–54)
AST: 20 U/L (ref 15–41)
Albumin: 3.9 g/dL (ref 3.5–5.0)
Anion gap: 7 (ref 5–15)
BILIRUBIN TOTAL: 0.6 mg/dL (ref 0.3–1.2)
BUN: 16 mg/dL (ref 6–20)
CALCIUM: 9.5 mg/dL (ref 8.9–10.3)
CO2: 25 mmol/L (ref 22–32)
CREATININE: 0.86 mg/dL (ref 0.44–1.00)
Chloride: 105 mmol/L (ref 101–111)
GFR calc Af Amer: >60 mL/min (ref >60)
Glucose, Bld: 107 mg/dL — ABNORMAL HIGH (ref 65–99)
Potassium: 3.4 mmol/L — ABNORMAL LOW (ref 3.5–5.1)
Sodium: 137 mmol/L (ref 135–145)
TOTAL PROTEIN: 7.3 g/dL (ref 6.5–8.1)

## 2017-04-21 LAB — GLUCOSE, CAPILLARY: Glucose-Capillary: 105 mg/dL — ABNORMAL HIGH (ref 65–99)

## 2017-04-21 LAB — LIPASE, BLOOD: LIPASE: 45 U/L (ref 11–51)

## 2017-04-21 LAB — TROPONIN I: Troponin I: <0.03 ng/mL (ref <0.03)

## 2017-04-21 NOTE — ED Triage Notes (Signed)
Pt reports pt was sitting in the couch and felt nausea and husband reports she passed out for about 2 minutes. Pt denies any other symptoms pt talks in complete sentences no distress noted

## 2017-04-21 NOTE — ED Provider Notes (Signed)
[facility] Emergency Department Provider Note   ____________________________________________   First MD Initiated Contact with Patient 04/21/17 2335     (approximate)  I have reviewed the triage vital signs and the nursing notes.   HISTORY  Chief Complaint Loss of Consciousness    HPI Laurie Long is a 76 y.o. female who presents to the ED from home with a chief complaint of syncope.  Patient states she was cooking all day, felt tired by the end of the evening.  States that a can fell from a cabinet directly onto her left great toe.  She was sitting on the couch soaking her toe in Epsom salts when she began to feel lightheaded and nauseated.  Husband reports patient passed out for about 2 minutes on the couch.  Was a little confused once she came to.  Currently completely back to baseline.  Voices complaints of left great toe pain.  Patient denies recent fever, chills, chest pain, shortness of breath, abdominal pain, vomiting, dysuria, diarrhea.  Denies recent travel.   Past Medical History:  Diagnosis Date  . Anxiety   . GERD (gastroesophageal reflux disease)   . History of colonic polyps     There are no active problems to display for this patient.   Past Surgical History:  Procedure Laterality Date  . ABDOMINAL HYSTERECTOMY    . COLONOSCOPY WITH PROPOFOL N/A 12/24/2014   Procedure: COLONOSCOPY WITH PROPOFOL;  Surgeon: Manya Silvas, MD;  Location: [facility] ENDOSCOPY;  Service: Endoscopy;  Laterality: N/A;    Prior to Admission medications   Medication Sig Start Date End Date Taking? Authorizing Provider  ALPRAZolam Duanne Moron) 0.5 MG tablet Take 0.5 mg by mouth at bedtime as needed for anxiety.    [provider]  aspirin 81 MG tablet Take 81 mg by mouth daily.    [provider]  gabapentin (NEURONTIN) 100 MG capsule Take 100 mg by mouth at bedtime.    [provider]  levocetirizine (XYZAL) 5 MG tablet Take 5 mg by  mouth every evening.    [provider]  meclizine (ANTIVERT) 25 MG tablet Take 25 mg by mouth 3 (three) times daily as needed for dizziness.    [provider]  Ranitidine HCl (ZANTAC PO) Take by mouth daily.    [provider]  vitamin B-12 (CYANOCOBALAMIN) 1000 MCG tablet Take 1,000 mcg by mouth daily.    [provider]    Allergies Codeine  Family History  Problem Relation Age of Onset  . Breast cancer Neg Hx     Social History Social History   Tobacco Use  . Smoking status: Never Smoker  Substance Use Topics  . Alcohol use: No    Alcohol/week: 0.0 oz  . Drug use: No    Review of Systems  Constitutional: No fever/chills. Eyes: No visual changes. ENT: No sore throat. Cardiovascular: Denies chest pain. Respiratory: Denies shortness of breath. Gastrointestinal: No abdominal pain.  No nausea, no vomiting.  No diarrhea.  No constipation. Genitourinary: Negative for dysuria. Musculoskeletal: Positive for left great toe pain.  Negative for back pain. Skin: Negative for rash. Neurological: Positive for syncope.  Negative for headaches, focal weakness or numbness.   ____________________________________________   PHYSICAL EXAM:  VITAL SIGNS: ED Triage Vitals  Enc Vitals Group     BP 04/21/17 2255 139/67     Pulse Rate 04/21/17 2255 77     Resp 04/21/17 2255 16     Temp 04/21/17  2255 98.5 F (36.9 C)     Temp Source 04/21/17 2255 Oral     SpO2 04/21/17 2255 98 %     Weight 04/21/17 2256 122 lb (55.3 kg)     Height 04/21/17 2256 5' (1.524 m)     Head Circumference --      Peak Flow --      Pain Score --      Pain Loc --      Pain Edu? --      Excl. in [place]? --     Constitutional: Alert and oriented. Well appearing and in no acute distress. Eyes: Conjunctivae are normal. PERRL. EOMI. Head: Atraumatic. Nose: No congestion/rhinnorhea. Mouth/Throat: Mucous membranes are moist.  Oropharynx non-erythematous. Neck: No stridor.   No carotid bruits. Cardiovascular: Normal rate, regular rhythm. Grossly normal heart sounds.  Good peripheral circulation. Respiratory: Normal respiratory effort.  No retractions. Lungs CTAB. Gastrointestinal: Soft and nontender to light or deep palpation. No distention. No abdominal bruits. No CVA tenderness. Musculoskeletal: Left great toe with mild contusion without significant swelling.  Limited range of motion secondary to pain.  2+ distal pulses.  Brisk, less than 5-second capillary refill.  No joint effusions. Neurologic: Alert and oriented x4.  CN II-XII grossly intact.  Normal speech and language. No gross focal neurologic deficits are appreciated.  Skin:  Skin is warm, dry and intact. No rash noted. Psychiatric: Mood and affect are normal. Speech and behavior are normal.  ____________________________________________   LABS (all labs ordered are listed, but only abnormal results are displayed)  Labs Reviewed  URINALYSIS, COMPLETE (UACMP) WITH MICROSCOPIC - Abnormal; Notable for the following components:      Result Value   Color, Urine YELLOW (*)    APPearance CLEAR (*)    Hgb urine dipstick SMALL (*)    Leukocytes, UA TRACE (*)    Squamous Epithelial / LPF 0-5 (*)    All other components within normal limits  COMPREHENSIVE METABOLIC PANEL - Abnormal; Notable for the following components:   Potassium 3.4 (*)    Glucose, Bld 107 (*)    ALT 10 (*)    All other components within normal limits  GLUCOSE, CAPILLARY - Abnormal; Notable for the following components:   Glucose-Capillary 105 (*)    All other components within normal limits  CBC  LIPASE, BLOOD  TROPONIN I  TROPONIN I  CBG MONITORING, ED   ____________________________________________  EKG  ED ECG REPORT I, Kimo Bancroft J, the attending physician, personally viewed and interpreted this ECG.   Date: 04/22/2017  EKG Time: 2337  Rate: 69  Rhythm: normal EKG, normal sinus rhythm  Axis: Normal  Intervals:none   ST&T Change: Nonspecific  ____________________________________________  RADIOLOGY  Ct Head Wo Contrast  Result Date: 04/22/2017 CLINICAL DATA:  Lightheadedness and loss consciousness. EXAM: CT HEAD WITHOUT CONTRAST TECHNIQUE: Contiguous axial images were obtained from the base of the skull through the vertex without intravenous contrast. COMPARISON:  None. FINDINGS: Brain: No mass lesion, intraparenchymal hemorrhage or extra-axial collection. No evidence of acute cortical infarct. Brain parenchyma and CSF-containing spaces are normal for age. Vascular: No hyperdense vessel or unexpected calcification. Skull: Normal visualized skull base, calvarium and extracranial soft tissues. Sinuses/Orbits: No sinus fluid levels or advanced mucosal thickening. No mastoid effusion. Normal orbits. IMPRESSION: Normal aging brain. Electronically Signed   By: Ulyses Jarred M.D.   On: 04/22/2017 00:25   Dg Chest Port 1 View  Result Date: 04/22/2017 CLINICAL DATA:  76 year old female with syncope. EXAM:  PORTABLE CHEST 1 VIEW COMPARISON:  Chest radiograph dated 11/22/2008 FINDINGS: The lungs are clear. There is no pleural effusion or pneumothorax. The cardiac silhouette is within normal limits. Coronary vascular calcification noted. There is osteopenia with degenerative changes of the spine. No acute osseous pathology. IMPRESSION: No active disease. Electronically Signed   By: Anner Crete M.D.   On: 04/22/2017 00:37   Dg Foot Complete Left  Result Date: 04/22/2017 CLINICAL DATA:  76 year old female with pain in the left great toe. EXAM: LEFT FOOT - COMPLETE 3+ VIEW COMPARISON:  Radiograph dated 05/28/2014 FINDINGS: There is no acute fracture or dislocation. The bones are osteopenic. No bone erosion or periosteal reaction. There is soft tissue swelling of the great toe. No radiopaque foreign object or soft tissue gas. IMPRESSION: 1. No acute fracture or dislocation. 2. Soft tissue swelling of the great toe.  Clinical correlation is recommended. Electronically Signed   By: Anner Crete M.D.   On: 04/22/2017 00:39    ____________________________________________   PROCEDURES  Procedure(s) performed: None  Procedures  Critical Care performed: No  ____________________________________________   INITIAL IMPRESSION / ASSESSMENT AND PLAN / ED COURSE  As part of my medical decision making, I reviewed the following data within the [place] History obtained from family, Nursing notes reviewed and incorporated, Labs reviewed, EKG interpreted, Old chart reviewed, Radiograph reviewed and Notes from prior ED visits.   76 year old female who presents with syncopal episode in the setting of fatigue after cooking all day, episode occurred status post left great toe crush injury.  Differential diagnosis includes but is not limited to TIA, CVA, ICH, CAD, PE, aortic dissection, infectious, metabolic etiologies, etc.  Patient is well-appearing and in no acute distress.  Will obtain screening lab work including troponin, imaging studies of head and toe, orthostatic vital signs and reassess.  Clinical Course as of Apr 22 426  Sat Apr 22, 2017  0110 Patient resting no acute distress.  Updated patient and family members of laboratory and imaging results thus far.  Will repeat troponin.  Fosfomycin given for trace leukocytes seen on urinalysis.  [JS]  [id number] Patient resting no acute distress.  Eager for discharge.  No complaints of dizziness.  Updated her and family members of repeat negative troponin.  Will place in podiatric shoe for left great toe injury.  She will follow-up with her PCP next week.  Strict return precautions given.  All verbalize understanding and agree with plan of care.  [JS]    Clinical Course User Index [JS] Paulette Blanch, MD     ____________________________________________   FINAL CLINICAL IMPRESSION(S) / ED DIAGNOSES  Final diagnoses:  Syncope, unspecified  syncope type  Contusion of left great toe without damage to nail, initial encounter     ED Discharge Orders    None       Note:  This document was prepared using Dragon voice recognition software and may include unintentional dictation errors.    Paulette Blanch, MD 04/22/17 [phone]

## 2017-04-21 NOTE — ED Notes (Addendum)
Pt states that she started feeling as if she was gonna pass out while cooking so she sat down on the couch, felt nauseous, and like "something was going around in her eyes". Husband states that she was passed out for 2 min, eyes rolled back and she was sweating. Pt states that she dropped a can on her left big toe when she felt weak. Is concerned if its' broken or not. Family at bedside.

## 2017-04-22 DIAGNOSIS — R55 Syncope and collapse: Secondary | ICD-10-CM | POA: Diagnosis not present

## 2017-04-22 LAB — URINALYSIS, COMPLETE (UACMP) WITH MICROSCOPIC
BILIRUBIN URINE: NEGATIVE
Bacteria, UA: NONE SEEN
GLUCOSE, UA: NEGATIVE mg/dL
KETONES UR: NEGATIVE mg/dL
NITRITE: NEGATIVE
PH: 5 (ref 5.0–8.0)
Protein, ur: NEGATIVE mg/dL
Specific Gravity, Urine: 1.008 (ref 1.005–1.030)

## 2017-04-22 LAB — TROPONIN I: Troponin I: <0.03 ng/mL (ref <0.03)

## 2017-04-22 MED ORDER — FOSFOMYCIN TROMETHAMINE 3 G PO PACK
3.0000 g | PACK | Freq: Once | ORAL | Status: AC
  Administered 2017-04-22: 3 g via ORAL
  Filled 2017-04-22: qty 3

## 2017-04-22 MED ORDER — SODIUM CHLORIDE 0.9 % IV BOLUS (SEPSIS)
500.0000 mL | Freq: Once | INTRAVENOUS | Status: AC
  Administered 2017-04-22: 500 mL via INTRAVENOUS

## 2017-04-22 NOTE — Discharge Instructions (Signed)
1.  Wear podiatric shoe as needed for comfort. 2.  Drink plenty of fluids daily. 3.  Return to the ER for recurrent or worsening symptoms, persistent vomiting, difficulty breathing or other concerns.

## 2018-01-09 ENCOUNTER — Other Ambulatory Visit: Payer: Self-pay | Admitting: Internal Medicine

## 2018-01-09 DIAGNOSIS — Z1231 Encounter for screening mammogram for malignant neoplasm of breast: Secondary | ICD-10-CM

## 2018-01-11 ENCOUNTER — Ambulatory Visit
Admission: RE | Admit: 2018-01-11 | Discharge: 2018-01-11 | Disposition: A | Discharge status: Home / Self Care | Payer: Medicare Other | Source: Ambulatory Visit | Attending: Internal Medicine | Admitting: Internal Medicine

## 2018-01-11 DIAGNOSIS — Z1231 Encounter for screening mammogram for malignant neoplasm of breast: Secondary | ICD-10-CM | POA: Diagnosis not present

## 2019-02-08 ENCOUNTER — Other Ambulatory Visit: Payer: Self-pay | Admitting: Internal Medicine

## 2019-02-08 DIAGNOSIS — Z1231 Encounter for screening mammogram for malignant neoplasm of breast: Secondary | ICD-10-CM

## 2019-05-07 ENCOUNTER — Ambulatory Visit
Admission: RE | Admit: 2019-05-07 | Discharge: 2019-05-07 | Disposition: A | Discharge status: Home / Self Care | Payer: Medicare Other | Source: Ambulatory Visit | Attending: Internal Medicine | Admitting: Internal Medicine

## 2019-05-07 DIAGNOSIS — Z1231 Encounter for screening mammogram for malignant neoplasm of breast: Secondary | ICD-10-CM | POA: Diagnosis present

## 2020-02-14 ENCOUNTER — Other Ambulatory Visit: Payer: Self-pay | Admitting: Internal Medicine

## 2020-02-14 DIAGNOSIS — Z1231 Encounter for screening mammogram for malignant neoplasm of breast: Secondary | ICD-10-CM

## 2020-05-07 ENCOUNTER — Other Ambulatory Visit: Payer: Self-pay

## 2020-05-07 ENCOUNTER — Ambulatory Visit
Admission: RE | Admit: 2020-05-07 | Discharge: 2020-05-07 | Disposition: A | Discharge status: Home / Self Care | Payer: Medicare Other | Source: Ambulatory Visit | Attending: Internal Medicine | Admitting: Internal Medicine

## 2020-05-07 DIAGNOSIS — Z1231 Encounter for screening mammogram for malignant neoplasm of breast: Secondary | ICD-10-CM | POA: Diagnosis present

## 2020-05-07 HISTORY — DX: Malignant neoplasm of endometrium: C54.1

## 2021-04-09 ENCOUNTER — Other Ambulatory Visit: Payer: Self-pay | Admitting: Internal Medicine

## 2021-04-09 DIAGNOSIS — Z1231 Encounter for screening mammogram for malignant neoplasm of breast: Secondary | ICD-10-CM

## 2021-05-18 ENCOUNTER — Other Ambulatory Visit: Payer: Self-pay

## 2021-05-18 ENCOUNTER — Ambulatory Visit
Admission: RE | Admit: 2021-05-18 | Discharge: 2021-05-18 | Disposition: A | Discharge status: Home / Self Care | Payer: Medicare Other | Source: Ambulatory Visit | Attending: Internal Medicine | Admitting: Internal Medicine

## 2021-05-18 DIAGNOSIS — Z1231 Encounter for screening mammogram for malignant neoplasm of breast: Secondary | ICD-10-CM | POA: Insufficient documentation

## 2021-07-05 ENCOUNTER — Other Ambulatory Visit: Payer: Self-pay | Admitting: Physician Assistant

## 2021-07-05 ENCOUNTER — Other Ambulatory Visit: Payer: Self-pay

## 2021-07-05 ENCOUNTER — Ambulatory Visit
Admission: RE | Admit: 2021-07-05 | Discharge: 2021-07-05 | Disposition: A | Discharge status: Home / Self Care | Payer: Medicare Other | Source: Ambulatory Visit | Attending: Physician Assistant | Admitting: Physician Assistant

## 2021-07-05 DIAGNOSIS — K859 Acute pancreatitis without necrosis or infection, unspecified: Secondary | ICD-10-CM

## 2021-07-05 MED ORDER — IOHEXOL 300 MG/ML  SOLN
75.0000 mL | Freq: Once | INTRAMUSCULAR | Status: AC | PRN
  Administered 2021-07-05: 75 mL via INTRAVENOUS

## 2021-08-04 ENCOUNTER — Encounter: Payer: Self-pay | Admitting: Gastroenterology

## 2021-08-04 NOTE — H&P (Signed)
? ?Pre-Procedure H&P ?  ?Patient ID: Laurie Long is a 81 y.o. female. ? ?Gastroenterology Provider: Annamaria Helling, DO ? ?Referring Provider: Dawson Bills, NP ?PCP: Tracie Harrier, MD ? ?Date: 08/05/2021 ? ?HPI ?Ms. Laurie Long is a 81 y.o. female who presents today for Colonoscopy for Abnormal CT-rectum abnormality. Husband at bedside. ? ?Patient with longstanding history of colon polyps.  Recently seen in the office in March for generalized abdominal pain.  Lipase was elevated with inflamed pancreas noted on CT.  Also noted on CT was rectal inflammation concerning for lesion versus underdistention.  She underwent a digital rectal exam in the office which did not demonstrate any abnormalities at that time.  She is unintentionally lost weight going from range of 118-124 down to 107-110 ? ?Has long battled constipation constipation.  However this has transitioned to diarrhea. ? ?Has had decreased appetite along with nausea vomiting diarrhea and weakness. ? ?Underwent colonoscopy 2016 with recommended 5-year follow-up when she had 2 tubular adenomas. ? ?She underwent several colonoscopies starting in 2012 after laterally spreading 3 cm lesion was noted.  She followed up with [facility] gastroenterology and underwent polyp resection and APC of this lesion. ? ?last reported labs hemoglobin 14 MCV 92 platelets 284,000 ? ?Past Medical History:  ?Diagnosis Date  ? Allergic genetic state   ? Anxiety   ? Endometrial cancer ([place])   ? GERD (gastroesophageal reflux disease)   ? History of colonic polyps   ? ? ?Past Surgical History:  ?Procedure Laterality Date  ? ABDOMINAL HYSTERECTOMY    ? COLONOSCOPY  04/12/2011  ? COLONOSCOPY  05/13/2011  ? COLONOSCOPY  03/12/2001  ? COLONOSCOPY  11/09/2011  ? COLONOSCOPY WITH PROPOFOL N/A 12/24/2014  ? Procedure: COLONOSCOPY WITH PROPOFOL;  Surgeon: Manya Silvas, MD;  Location: [facility] ENDOSCOPY;  Service: Endoscopy;  Laterality: N/A;  ? ESOPHAGOGASTRODUODENOSCOPY  04/12/2011   ? ? ?Family History ?No h/o GI disease or malignancy ? ?Review of Systems  ?Constitutional:  Positive for appetite change and unexpected weight change. Negative for activity change, chills, diaphoresis, fatigue and fever.  ?HENT:  Negative for trouble swallowing and voice change.   ?Respiratory:  Negative for shortness of breath and wheezing.   ?Cardiovascular:  Negative for chest pain, palpitations and leg swelling.  ?Gastrointestinal:  Positive for abdominal pain, diarrhea, nausea and vomiting. Negative for abdominal distention, anal bleeding, blood in stool, constipation and rectal pain.  ?Musculoskeletal:  Negative for arthralgias and myalgias.  ?Skin:  Negative for color change and pallor.  ?Neurological:  Positive for weakness. Negative for dizziness and syncope.  ?Psychiatric/Behavioral:  Negative for confusion.   ?All other systems reviewed and are negative.  ? ?Medications ?No current facility-administered medications on file prior to encounter.  ? ?Current Outpatient Medications on File Prior to Encounter  ?Medication Sig Dispense Refill  ? ALPRAZolam (XANAX) 0.5 MG tablet Take 0.5 mg by mouth at bedtime as needed for anxiety.    ? amLODipine (NORVASC) 2.5 MG tablet Take 2.5 mg by mouth daily.    ? APOAEQUORIN PO Take by mouth daily.    ? aspirin 81 MG tablet Take 81 mg by mouth daily.    ? buPROPion (WELLBUTRIN XL) 150 MG 24 hr tablet Take 150 mg by mouth daily.    ? CALCIUM CITRATE-VITAMIN D3 PO Take by mouth daily.    ? citalopram (CELEXA) 10 MG tablet Take 10 mg by mouth daily.    ? DOCUSATE SODIUM PO Take 100 mg by mouth 2 (  two) times daily.    ? donepezil (ARICEPT) 5 MG tablet Take 5 mg by mouth at bedtime.    ? esomeprazole (NEXIUM) 20 MG packet Take 20 mg by mouth daily before breakfast.    ? gabapentin (NEURONTIN) 100 MG capsule Take 100 mg by mouth at bedtime.    ? Melatonin 10 MG CAPS Take by mouth daily.    ? promethazine (PHENERGAN) 25 MG tablet Take 25 mg by mouth every 8 (eight) hours as  needed for nausea or vomiting.    ? vitamin B-12 (CYANOCOBALAMIN) 1000 MCG tablet Take 1,000 mcg by mouth daily.    ? levocetirizine (XYZAL) 5 MG tablet Take 5 mg by mouth every evening.    ? meclizine (ANTIVERT) 12.5 MG tablet Take 25 mg by mouth 2 (two) times daily.    ? Ranitidine HCl (ZANTAC PO) Take by mouth daily.    ? ? ?Pertinent medications related to GI and procedure were reviewed by me with the patient prior to the procedure ? ? ?Current Facility-Administered Medications:  ?  0.9 %  sodium chloride infusion, , Intravenous, Continuous, Annamaria Helling, DO, Last Rate: 20 mL/hr at 08/05/21 1156, New Bag at 08/05/21 1156 ?  ?  ? ?Allergies  ?Allergen Reactions  ? Codeine Other (See Comments)  ?  "makes me feel like everything is crazy"  ? ?Allergies were reviewed by me prior to the procedure ? ?Objective  ? ?Body mass index is 20.7 kg/m?. ?Vitals:  ? 08/05/21 1136  ?BP: 122/74  ?Pulse: 69  ?Resp: 19  ?Temp: (!) 97.3 ?F (36.3 ?C)  ?TempSrc: Temporal  ?SpO2: 98%  ?Weight: 48.1 kg  ?Height: 5' (1.524 m)  ? ? ? ?Physical Exam ?Vitals and nursing note reviewed.  ?Constitutional:   ?   Appearance: She is not ill-appearing, toxic-appearing or diaphoretic.  ?   Comments: Thin and frail appearing  ?HENT:  ?   Head: Normocephalic and atraumatic.  ?   Nose: Nose normal.  ?   Mouth/Throat:  ?   Mouth: Mucous membranes are moist.  ?   Pharynx: Oropharynx is clear.  ?Eyes:  ?   General: No scleral icterus. ?   Extraocular Movements: Extraocular movements intact.  ?Cardiovascular:  ?   Rate and Rhythm: Normal rate and regular rhythm.  ?   Heart sounds: Normal heart sounds. No murmur heard. ?  No friction rub. No gallop.  ?Pulmonary:  ?   Effort: Pulmonary effort is normal. No respiratory distress.  ?   Breath sounds: Normal breath sounds. No wheezing, rhonchi or rales.  ?Abdominal:  ?   General: Abdomen is flat. Bowel sounds are normal. There is no distension.  ?   Palpations: Abdomen is soft.  ?   Tenderness: There  is no abdominal tenderness. There is no guarding or rebound.  ?Musculoskeletal:  ?   Cervical back: Neck supple.  ?   Right lower leg: No edema.  ?   Left lower leg: No edema.  ?Skin: ?   General: Skin is warm and dry.  ?   Coloration: Skin is not jaundiced or pale.  ?Neurological:  ?   Mental Status: She is alert and oriented to person, place, and time. Mental status is at baseline.  ? ? ? ?Assessment:  ?Ms. Laurie Long is a 81 y.o. female  who presents today for Colonoscopy for Abnormal CT-rectum abnormality. ? ?Plan:  ?Colonoscopy with possible intervention today ? ?Colonoscopy with possible biopsy, control of bleeding, polypectomy,  and interventions as necessary has been discussed with the patient/patient representative. Informed consent was obtained from the patient/patient representative after explaining the indication, nature, and risks of the procedure including but not limited to death, bleeding, perforation, missed neoplasm/lesions, cardiorespiratory compromise, and reaction to medications. Opportunity for questions was given and appropriate answers were provided. Patient/patient representative has verbalized understanding is amenable to undergoing the procedure. ? ? ?Annamaria Helling, DO  ?[facility] ? ?Portions of the record may have been created with voice recognition software. Occasional wrong-word or 'sound-a-like' substitutions may have occurred due to the inherent limitations of voice recognition software.  Read the chart carefully and recognize, using context, where substitutions may have occurred. ?

## 2021-08-05 ENCOUNTER — Ambulatory Visit: Payer: Medicare Other | Admitting: Anesthesiology

## 2021-08-05 ENCOUNTER — Encounter
Admission: RE | Disposition: A | Discharge status: Home / Self Care | Payer: Self-pay | Source: Home / Self Care | Attending: Gastroenterology

## 2021-08-05 ENCOUNTER — Ambulatory Visit
Admission: RE | Admit: 2021-08-05 | Discharge: 2021-08-05 | Disposition: A | Discharge status: Home / Self Care | Payer: Medicare Other | Attending: Gastroenterology | Admitting: Gastroenterology

## 2021-08-05 ENCOUNTER — Encounter: Payer: Self-pay | Admitting: Gastroenterology

## 2021-08-05 DIAGNOSIS — R933 Abnormal findings on diagnostic imaging of other parts of digestive tract: Secondary | ICD-10-CM | POA: Diagnosis present

## 2021-08-05 DIAGNOSIS — Z8542 Personal history of malignant neoplasm of other parts of uterus: Secondary | ICD-10-CM | POA: Diagnosis not present

## 2021-08-05 DIAGNOSIS — R634 Abnormal weight loss: Secondary | ICD-10-CM | POA: Diagnosis not present

## 2021-08-05 DIAGNOSIS — K219 Gastro-esophageal reflux disease without esophagitis: Secondary | ICD-10-CM | POA: Diagnosis not present

## 2021-08-05 DIAGNOSIS — F419 Anxiety disorder, unspecified: Secondary | ICD-10-CM | POA: Insufficient documentation

## 2021-08-05 DIAGNOSIS — D128 Benign neoplasm of rectum: Secondary | ICD-10-CM | POA: Diagnosis not present

## 2021-08-05 DIAGNOSIS — I10 Essential (primary) hypertension: Secondary | ICD-10-CM | POA: Diagnosis not present

## 2021-08-05 DIAGNOSIS — Z90710 Acquired absence of both cervix and uterus: Secondary | ICD-10-CM | POA: Insufficient documentation

## 2021-08-05 HISTORY — PX: COLONOSCOPY: SHX5424

## 2021-08-05 HISTORY — DX: Allergy status to unspecified drugs, medicaments and biological substances: Z88.9

## 2021-08-05 SURGERY — COLONOSCOPY
Anesthesia: General

## 2021-08-05 MED ORDER — PROPOFOL 500 MG/50ML IV EMUL
INTRAVENOUS | Status: DC | PRN
  Administered 2021-08-05: 150 ug/kg/min via INTRAVENOUS

## 2021-08-05 MED ORDER — SODIUM CHLORIDE 0.9 % IV SOLN: INTRAVENOUS | Status: DC

## 2021-08-05 MED ORDER — PROPOFOL 10 MG/ML IV BOLUS
INTRAVENOUS | Status: DC | PRN
  Administered 2021-08-05: 40 mg via INTRAVENOUS
  Administered 2021-08-05: 10 mg via INTRAVENOUS

## 2021-08-05 MED ORDER — LIDOCAINE HCL (CARDIAC) PF 100 MG/5ML IV SOSY
PREFILLED_SYRINGE | INTRAVENOUS | Status: DC | PRN
  Administered 2021-08-05: 80 mg via INTRAVENOUS

## 2021-08-05 NOTE — Op Note (Signed)
[facility] ?Gastroenterology ?Patient Name: Laurie Long ?Procedure Date: 08/05/2021 11:42 AM ?MRN: [id number] ?Account #: [id number] ?Date of Birth: 1941-01-15 ?Admit Type: Outpatient ?Age: 81 ?Room: [facility] ENDO ROOM 1 ?Gender: Female ?Note Status: Finalized ?Instrument Name: Colonoscope [id number] ?Procedure:             Colonoscopy ?Indications:           Abnormal CT of the GI tract ?Providers:             Annamaria Helling DO, DO ?Referring MD:          Tracie Harrier, MD (Referring MD) ?Medicines:             Monitored Anesthesia Care ?Complications:         No immediate complications. Estimated blood loss:  ?                       Minimal. ?Procedure:             Pre-Anesthesia Assessment: ?                       - Prior to the procedure, a History and Physical was  ?                       performed, and patient medications and allergies were  ?                       reviewed. The patient is competent. The risks and  ?                       benefits of the procedure and the sedation options and  ?                       risks were discussed with the patient. All questions  ?                       were answered and informed consent was obtained.  ?                       Patient identification and proposed procedure were  ?                       verified by the physician, the nurse, the anesthetist  ?                       and the technician in the endoscopy suite. Mental  ?                       Status Examination: alert and oriented. Airway  ?                       Examination: normal oropharyngeal airway and neck  ?                       mobility. Respiratory Examination: clear to  ?                       auscultation. CV Examination: RRR, no murmurs, no S3  ?  or S4. Prophylactic Antibiotics: The patient does not  ?                       require prophylactic antibiotics. Prior  ?                       Anticoagulants: The patient has taken no previous  ?                        anticoagulant or antiplatelet agents. ASA Grade  ?                       Assessment: II - A patient with mild systemic disease.  ?                       After reviewing the risks and benefits, the patient  ?                       was deemed in satisfactory condition to undergo the  ?                       procedure. The anesthesia plan was to use monitored  ?                       anesthesia care (MAC). Immediately prior to  ?                       administration of medications, the patient was  ?                       re-assessed for adequacy to receive sedatives. The  ?                       heart rate, respiratory rate, oxygen saturations,  ?                       blood pressure, adequacy of pulmonary ventilation, and  ?                       response to care were monitored throughout the  ?                       procedure. The physical status of the patient was  ?                       re-assessed after the procedure. ?                       After obtaining informed consent, the colonoscope was  ?                       passed under direct vision. Throughout the procedure,  ?                       the patient's blood pressure, pulse, and oxygen  ?                       saturations were monitored continuously. The  ?  Colonoscope was introduced through the anus and  ?                       advanced to the the terminal ileum, with  ?                       identification of the appendiceal orifice and IC  ?                       valve. The colonoscopy was performed without  ?                       difficulty. The patient tolerated the procedure well.  ?                       The quality of the bowel preparation was evaluated  ?                       using the BBPS [facility] Bowel Preparation Scale) with  ?                       scores of: Right Colon = 3, Transverse Colon = 3 and  ?                       Left Colon = 3 (entire mucosa seen well with no  ?                       residual  staining, small fragments of stool or opaque  ?                       liquid). The total BBPS score equals 9. The terminal  ?                       ileum, ileocecal valve, appendiceal orifice, and  ?                       rectum were photographed. ?Findings: ?     The digital rectal exam findings include ridge along anal verge.  ?     Pertinent negatives include normal sphincter tone. ?     The terminal ileum appeared normal. Estimated blood loss: none. ?     A 1 to 2 mm polyp was found in the rectum. The polyp was sessile. The  ?     polyp was removed with a cold biopsy forceps. Resection and retrieval  ?     were complete. Estimated blood loss was minimal. ?     Normal mucosa was found in the entire colon. Biopsies for histology were  ?     taken with a cold forceps from the right colon and left colon for  ?     evaluation of microscopic colitis. Estimated blood loss was minimal. ?     No rectal/anal lesion was appreciated on endoscopy that was reported on  ?     CT scan. Estimated blood loss: none. ?     The exam was otherwise without abnormality on direct and retroflexion  ?     views. ?Impression:            - Ridge along anal verge found on digital  rectal exam. ?                       - The examined portion of the ileum was normal. ?                       - One 1 to 2 mm polyp in the rectum, removed with a  ?                       cold biopsy forceps. Resected and retrieved. ?                       - Normal mucosa in the entire examined colon. Biopsied. ?                       - The examination was otherwise normal on direct and  ?                       retroflexion views. ?Recommendation:        - Discharge patient to home. ?                       - Resume previous diet. ?                       - Continue present medications. ?                       - Await pathology results. ?                       - Return to GI clinic as previously scheduled. ?                       - The findings and recommendations were  discussed with  ?                       the patient. ?                       - The findings and recommendations were discussed with  ?                       the patient's family. ?Procedure Code(s):     --- Professional --- ?                       [phone], Colonoscopy, flexible; with biopsy, single or  ?                       multiple ?Diagnosis Code(s):     --- Professional --- ?                       K62.1, Rectal polyp ?                       R93.3, Abnormal findings on diagnostic imaging of  ?                       other parts of digestive tract ?CPT copyright 2019 American Medical Association. All rights reserved. ?The codes  documented in this report are preliminary and upon coder review may  ?be revised to meet current compliance requirements. ?Attending Participation: ?     I personally performed the entire procedure. ?Volney American, DO ?Annamaria Helling DO, DO ?08/05/2021 1:24:57 PM ?This report has been signed electronically. ?Number of Addenda: 0 ?Note Initiated On: 08/05/2021 11:42 AM ?Scope Withdrawal Time: 0 hours 22 minutes 51 seconds  ?Total Procedure Duration: 0 hours 34 minutes 46 seconds  ?Estimated Blood Loss:  Estimated blood loss was minimal. ?     [facility] ?

## 2021-08-05 NOTE — Transfer of Care (Signed)
Immediate Anesthesia Transfer of Care Note ? ?Patient: Laurie Long ? ?Procedure(s) Performed: COLONOSCOPY ? ?Patient Location: Endoscopy Unit ? ?Anesthesia Type:General ? ?Level of Consciousness: drowsy ? ?Airway & Oxygen Therapy: Patient Spontanous Breathing ? ?Post-op Assessment: Report given to RN and Post -op Vital signs reviewed and stable ? ?Post vital signs: Reviewed and stable ? ?Last Vitals:  ?Vitals Value Taken Time  ?BP 116/62 08/05/21 1315  ?Temp 36.1 ?C 08/05/21 1315  ?Pulse 55 08/05/21 1315  ?Resp 16 08/05/21 1315  ?SpO2 100 % 08/05/21 1315  ?Vitals shown include unvalidated device data. ? ?Last Pain:  ?Vitals:  ? 08/05/21 1315  ?TempSrc: Temporal  ?PainSc: Asleep  ?   ? ?  ? ?Complications: No notable events documented. ?

## 2021-08-05 NOTE — Anesthesia Preprocedure Evaluation (Addendum)
Anesthesia Evaluation  ?Patient identified by MRN, date of birth, ID band ?Patient awake ? ? ? ?Reviewed: ?Allergy & Precautions, NPO status , Patient's Chart, lab work & pertinent test results ? ?Airway ?Mallampati: III ? ?TM Distance: >3 FB ?Neck ROM: full ? ? ? Dental ? ?(+) Poor Dentition, Chipped ?  ?Pulmonary ?neg pulmonary ROS,  ?  ?Pulmonary exam normal ? ? ? ? ? ? ? Cardiovascular ?hypertension, Pt. on medications ?Normal cardiovascular exam ? ? ?  ?Neuro/Psych ?PSYCHIATRIC DISORDERS Anxiety negative neurological ROS ?   ? GI/Hepatic ?Neg liver ROS, GERD  Controlled,  ?Endo/Other  ?negative endocrine ROS ? Renal/GU ?negative Renal ROS  ?negative genitourinary ?  ?Musculoskeletal ? ? Abdominal ?Normal abdominal exam  (+)   ?Peds ? Hematology ?negative hematology ROS ?(+)   ?Anesthesia Other Findings ?Past Medical History: ?No date: Allergic genetic state ?No date: Anxiety ?No date: Endometrial cancer ([place]) ?No date: GERD (gastroesophageal reflux disease) ?No date: History of colonic polyps ? ?Past Surgical History: ?No date: ABDOMINAL HYSTERECTOMY ?04/12/2011: COLONOSCOPY ?05/13/2011: COLONOSCOPY ?03/12/2001: COLONOSCOPY ?11/09/2011: COLONOSCOPY ?12/24/2014: COLONOSCOPY WITH PROPOFOL; N/A ?    Comment:  Procedure: COLONOSCOPY WITH PROPOFOL;  Surgeon: Herbie Baltimore T ?             Vira Agar, MD;  Location: [facility];  Service:  ?             Endoscopy;  Laterality: N/A; ?04/12/2011: ESOPHAGOGASTRODUODENOSCOPY ? ? ? ? Reproductive/Obstetrics ?negative OB ROS ? ?  ? ? ? ? ? ? ? ? ? ? ? ? ? ?  ?  ? ? ? ? ? ? ? ?Anesthesia Physical ?Anesthesia Plan ? ?ASA: 2 ? ?Anesthesia Plan: General  ? ?Post-op Pain Management:   ? ?Induction: Intravenous ? ?PONV Risk Score and Plan: Propofol infusion and TIVA ? ?Airway Management Planned: Natural Airway and Simple Face Mask ? ?Additional Equipment:  ? ?Intra-op Plan:  ? ?Post-operative Plan:  ? ?Informed Consent: I have reviewed the patients  History and Physical, chart, labs and discussed the procedure including the risks, benefits and alternatives for the proposed anesthesia with the patient or authorized representative who has indicated his/her understanding and acceptance.  ? ? ? ?Dental advisory given ? ?Plan Discussed with: Anesthesiologist, CRNA and Surgeon ? ?Anesthesia Plan Comments:   ? ? ? ? ? ?Anesthesia Quick Evaluation ? ?

## 2021-08-05 NOTE — Interval H&P Note (Signed)
History and Physical Interval Note: Preprocedure H&P from 08/05/21 ? was reviewed and there was no interval change after seeing and examining the patient.  Written consent was obtained from the patient after discussion of risks, benefits, and alternatives. Patient has consented to proceed with Colonoscopy with possible intervention ? ? ?08/05/2021 ?12:20 PM ? ?Laurie Long  has presented today for surgery, with the diagnosis of Abnormal CT scan, gastrointestinal tract (R93.3) ?Constipation, unspecified constipation type (K59.00).  The various methods of treatment have been discussed with the patient and family. After consideration of risks, benefits and other options for treatment, the patient has consented to  Procedure(s): ?COLONOSCOPY (N/A) as a surgical intervention.  The patient's history has been reviewed, patient examined, no change in status, stable for surgery.  I have reviewed the patient's chart and labs.  Questions were answered to the patient's satisfaction.   ? ? ?Laurie Long ? ? ?

## 2021-08-06 LAB — SURGICAL PATHOLOGY

## 2021-08-07 NOTE — Anesthesia Postprocedure Evaluation (Signed)
Anesthesia Post Note ? ?Patient: Laurie Long ? ?Procedure(s) Performed: COLONOSCOPY ? ?Patient location during evaluation: Endoscopy ?Anesthesia Type: General ?Level of consciousness: awake and alert ?Pain management: pain level controlled ?Vital Signs Assessment: post-procedure vital signs reviewed and stable ?Respiratory status: spontaneous breathing, nonlabored ventilation and respiratory function stable ?Cardiovascular status: blood pressure returned to baseline and stable ?Postop Assessment: no apparent nausea or vomiting ?Anesthetic complications: no ? ? ?No notable events documented. ? ? ?Last Vitals:  ?Vitals:  ? 08/05/21 1136 08/05/21 1315  ?BP: 122/74 116/62  ?Pulse: 69 (!) 54  ?Resp: 19 16  ?Temp: (!) 36.3 ?C (!) 36.1 ?C  ?SpO2: 98% 100%  ?  ?Last Pain:  ?Vitals:  ? 08/06/21 0726  ?TempSrc:   ?PainSc: 0-No pain  ? ? ?  ?  ?  ?  ?  ?  ? ?Iran Ouch ? ? ? ? ?

## 2021-08-09 ENCOUNTER — Encounter: Payer: Self-pay | Admitting: Gastroenterology

## 2021-09-03 ENCOUNTER — Other Ambulatory Visit: Payer: Self-pay

## 2021-09-03 ENCOUNTER — Emergency Department: Payer: Medicare Other

## 2021-09-03 ENCOUNTER — Emergency Department
Admission: EM | Admit: 2021-09-03 | Discharge: 2021-09-03 | Disposition: A | Discharge status: Home / Self Care | Payer: Medicare Other | Attending: Emergency Medicine | Admitting: Emergency Medicine

## 2021-09-03 ENCOUNTER — Encounter: Payer: Self-pay | Admitting: Intensive Care

## 2021-09-03 DIAGNOSIS — F0390 Unspecified dementia without behavioral disturbance: Secondary | ICD-10-CM | POA: Insufficient documentation

## 2021-09-03 DIAGNOSIS — K5900 Constipation, unspecified: Secondary | ICD-10-CM | POA: Insufficient documentation

## 2021-09-03 HISTORY — DX: Depression, unspecified: F32.A

## 2021-09-03 HISTORY — DX: Unspecified dementia, unspecified severity, without behavioral disturbance, psychotic disturbance, mood disturbance, and anxiety: F03.90

## 2021-09-03 MED ORDER — FLEET ENEMA 7-19 GM/118ML RE ENEM
1.0000 | ENEMA | Freq: Once | RECTAL | Status: AC
  Administered 2021-09-03: 1 via RECTAL

## 2021-09-03 MED ORDER — LACTULOSE 10 GM/15ML PO SOLN
30.0000 g | Freq: Once | ORAL | Status: AC
  Administered 2021-09-03: 30 g via ORAL
  Filled 2021-09-03: qty 60

## 2021-09-03 NOTE — ED Notes (Signed)
First Nurse Note:  Pt to ED from [facility] for chronic constipation. Pt reporting that she has not had a bowel movement since she had her colonoscopy on 4/6. Pt denied N/V or abdominal pain while at [facility], they report that pt does not have abdominal distention. Pt is currently in NAD.  ?

## 2021-09-03 NOTE — ED Notes (Signed)
Pt arrives to room with husband pushing in wheelchair./ Pt stating they had a colonoscopy on May 5th and has not had a BM since. Pt denies passing gas as well.  ?

## 2021-09-03 NOTE — ED Provider Notes (Signed)
? ? ?[facility] ?Emergency Department Provider Note ? ? ? ? Event Date/Time  ? First MD Initiated Contact with Patient 09/03/21 1534   ?  (approximate) ? ? ?History  ? ?Constipation ? ? ?HPI ? ?Laurie Long is a 81 y.o. female with a history of dementia, GERD, and chronic constipation, presents to the ED reporting that she has not had a bowel movement in the last 4 weeks.  Patient had a normal colonoscopy, available for review and care everywhere, on April 5.  She notes a normal bowel prep cleanout prior to the procedure.  She would advise that since that time she is not passed any stool, despite eating regularly daily. Patient's husband endorses she eats very little at baseline. She has been taking colace intermittently, without benefit. She denies fevers, chills, N/V, diarrhea, or fecal incontinence.  ?  ? ? ?Physical Exam  ? ?Triage Vital Signs: ?ED Triage Vitals [09/03/21 2841]  ?Enc Vitals Group  ?   BP (!) 143/83  ?   Pulse Rate 76  ?   Resp 16  ?   Temp 98.6 ?F (37 ?C)  ?   Temp Source Oral  ?   SpO2 97 %  ?   Weight 106 lb (48.1 kg)  ?   Height '5\' 1"'$  (1.549 m)  ?   Head Circumference   ?   Peak Flow   ?   Pain Score 0  ?   Pain Loc   ?   Pain Edu?   ?   Excl. in [place]?   ? ? ?Most recent vital signs: ?Vitals:  ? 09/03/21 1453  ?BP: (!) 143/83  ?Pulse: 76  ?Resp: 16  ?Temp: 98.6 ?F (37 ?C)  ?SpO2: 97%  ? ? ?General Awake, no distress.  ?HEENT NCAT. PERRL. EOMI. No rhinorrhea. Mucous membranes are moist.  ?CV:  Good peripheral perfusion. RRR ?RESP:  Normal effort. CTA ?ABD:  No distention. Soft, nontender. Normal bowel sounds noted ? ? ?ED Results / Procedures / Treatments  ? ?Labs ?(all labs ordered are listed, but only abnormal results are displayed) ?Labs Reviewed - No data to display ? ? ?EKG ? ? ?RADIOLOGY ? ?I personally viewed and evaluated these images as part of my medical decision making, as well as reviewing the written report by the radiologist. ? ?ED Provider Interpretation:  moderate stool burden without impaction or obstructions} ? ?DG Abdomen 1 View ? ?Result Date: 09/03/2021 ?CLINICAL DATA:  Constipation. EXAM: ABDOMEN - 1 VIEW COMPARISON:  None Available. FINDINGS: No abnormal bowel dilatation is noted. Moderate amount of stool seen throughout the colon. Phleboliths are noted in the pelvis. IMPRESSION: Moderate stool burden.  No abnormal bowel dilatation. Electronically Signed   By: Marijo Conception M.D.   On: 09/03/2021 16:03   ? ? ?PROCEDURES: ? ?Critical Care performed: No ? ?Procedures ? ? ?MEDICATIONS ORDERED IN ED: ?Medications  ?lactulose (CHRONULAC) 10 GM/15ML solution 30 g (30 g Oral Given 09/03/21 1637)  ?sodium phosphate (FLEET) 7-19 GM/118ML enema 1 enema (1 enema Rectal Given 09/03/21 1637)  ? ? ? ?IMPRESSION / MDM / ASSESSMENT AND PLAN / ED COURSE  ?I reviewed the triage vital signs and the nursing notes. ?             ?               ? ?Differential diagnosis includes, but is not limited to, acute appendicitis, diverticulitis, urinary tract infection/pyelonephritis, endometriosis, bowel  obstruction, colitis, renal colic, gastroenteritis, hernia, etc. ? ? ?Geriatric patient with ED evaluation concerns for constipation.  Patient noted to spend 3 to 4 weeks since she had a reasonable bowel movement.  Patient continues to have normal oral intake.  No reports of fever, fecal incontinence, feculent emesis, or abdominal pain.  Patient is evaluated for her complaints in the ED, found to have a reassuring work-up.  Plain films of the abdomen reveal a moderate stool burden without evidence of obstruction or impaction.  Patient agrees to a bedside enema which was performed after an overdose of lactulose was given.  Patient reports a meaningful stool after interim evaluation.  Patient's diagnosis is consistent with constipation. Patient will be discharged home with directions to take daily MiraLAX for continued stool softening. Patient is to follow up with her primary provider or GI  provider as needed or otherwise directed. Patient is given ED precautions to return to the ED for any worsening or new symptoms. ? ?FINAL CLINICAL IMPRESSION(S) / ED DIAGNOSES  ? ?Final diagnoses:  ?Constipation, unspecified constipation type  ? ? ? ?Rx / DC Orders  ? ?ED Discharge Orders   ? ? None  ? ?  ? ? ? ?Note:  This document was prepared using Dragon voice recognition software and may include unintentional dictation errors. ? ?  ?Melvenia Needles, PA-C ?09/03/21 1828 ? ?  ?Blake Divine, MD ?09/03/21 1937 ? ?

## 2021-09-03 NOTE — ED Notes (Signed)
PT provided with dc ppw. Pt  questions answered.  Pt follow up and rx information reviewed. Pt  declines vs at dc. Pt  provides verbal consent for dc and is ambulatory to lobby on foot alert and oriented x4. ?

## 2021-09-03 NOTE — Discharge Instructions (Signed)
We have managed her obstipation with medicines given in the ED including lactulose which is a stool softener and a fleets enema.  You should continue to increase your fluid intake, as well as increasing your fiber rich foods including fruits and green leafy vegetables.  Consider taking OTC MiraLAX 2 doses a day, to promote normal stools.  Follow-up with primary provider or return to the ED if needed. ?

## 2021-09-03 NOTE — ED Triage Notes (Signed)
Patient reports having colonoscopy April 5th and has had no BM since. Denies pain  ?

## 2021-09-03 NOTE — ED Notes (Signed)
Bedside potty to bedside at this time. ?

## 2021-09-26 ENCOUNTER — Emergency Department: Payer: Medicare Other

## 2021-09-26 ENCOUNTER — Other Ambulatory Visit: Payer: Self-pay

## 2021-09-26 ENCOUNTER — Emergency Department
Admission: EM | Admit: 2021-09-26 | Discharge: 2021-09-26 | Disposition: A | Discharge status: Home / Self Care | Payer: Medicare Other | Attending: Emergency Medicine | Admitting: Emergency Medicine

## 2021-09-26 DIAGNOSIS — R634 Abnormal weight loss: Secondary | ICD-10-CM | POA: Diagnosis not present

## 2021-09-26 DIAGNOSIS — R110 Nausea: Secondary | ICD-10-CM | POA: Diagnosis present

## 2021-09-26 DIAGNOSIS — E860 Dehydration: Secondary | ICD-10-CM | POA: Diagnosis not present

## 2021-09-26 DIAGNOSIS — F0390 Unspecified dementia without behavioral disturbance: Secondary | ICD-10-CM | POA: Diagnosis not present

## 2021-09-26 LAB — COMPREHENSIVE METABOLIC PANEL
ALT: 17 U/L (ref 0–44)
AST: 28 U/L (ref 15–41)
Albumin: 4.4 g/dL (ref 3.5–5.0)
Alkaline Phosphatase: 53 U/L (ref 38–126)
Anion gap: 13 (ref 5–15)
BUN: 35 mg/dL — ABNORMAL HIGH (ref 8–23)
CO2: 29 mmol/L (ref 22–32)
Calcium: 9.2 mg/dL (ref 8.9–10.3)
Chloride: 96 mmol/L — ABNORMAL LOW (ref 98–111)
Creatinine, Ser: 1.12 mg/dL — ABNORMAL HIGH (ref 0.44–1.00)
GFR, Estimated: 49 mL/min — ABNORMAL LOW (ref >60)
Glucose, Bld: 99 mg/dL (ref 70–99)
Potassium: 3.3 mmol/L — ABNORMAL LOW (ref 3.5–5.1)
Sodium: 138 mmol/L (ref 135–145)
Total Bilirubin: 1.5 mg/dL — ABNORMAL HIGH (ref 0.3–1.2)
Total Protein: 7.6 g/dL (ref 6.5–8.1)

## 2021-09-26 LAB — CBC
HCT: 41.8 % (ref 36.0–46.0)
Hemoglobin: 13.7 g/dL (ref 12.0–15.0)
MCH: 30.5 pg (ref 26.0–34.0)
MCHC: 32.8 g/dL (ref 30.0–36.0)
MCV: 93.1 fL (ref 80.0–100.0)
Platelets: 178 10*3/uL (ref 150–400)
RBC: 4.49 MIL/uL (ref 3.87–5.11)
RDW: 12.7 % (ref 11.5–15.5)
WBC: 9.4 10*3/uL (ref 4.0–10.5)
nRBC: 0 % (ref 0.0–0.2)

## 2021-09-26 LAB — LIPASE, BLOOD: Lipase: 53 U/L — ABNORMAL HIGH (ref 11–51)

## 2021-09-26 MED ORDER — SODIUM CHLORIDE 0.9 % IV SOLN
1000.0000 mL | Freq: Once | INTRAVENOUS | Status: AC
  Administered 2021-09-26: 1000 mL via INTRAVENOUS

## 2021-09-26 MED ORDER — ONDANSETRON HCL 4 MG/2ML IJ SOLN
4.0000 mg | Freq: Once | INTRAMUSCULAR | Status: AC
  Administered 2021-09-26: 4 mg via INTRAVENOUS
  Filled 2021-09-26: qty 2

## 2021-09-26 NOTE — ED Provider Notes (Signed)
[facility] Provider Note    Event Date/Time   First MD Initiated Contact with Patient 09/26/21 1358     (approximate)   History   Nausea (Emesis/)   HPI  Laurie Long is a 81 y.o. female with a history of dementia, anxiety, GERD who presents with complaints of nausea and weight loss.  Patient reports over the last 5 months she has had frequent nausea which has led to weight loss.  She had a CT scan performed in March which only showed thickening of the rectum, she had a follow-up colonoscopy which was reassuring.  She denies abdominal pain, no chest pain.  No history of smoking.     Physical Exam   Triage Vital Signs: ED Triage Vitals  Enc Vitals Group     BP 09/26/21 1351 (!) 145/73     Pulse Rate 09/26/21 1351 67     Resp 09/26/21 1351 18     Temp 09/26/21 1351 97.8 F (36.6 C)     Temp Source 09/26/21 1351 Oral     SpO2 09/26/21 1351 96 %     Weight 09/26/21 1349 48 kg (105 lb 13.1 oz)     Height --      Head Circumference --      Peak Flow --      Pain Score 09/26/21 1349 0     Pain Loc --      Pain Edu? --      Excl. in [place]? --     Most recent vital signs: Vitals:   09/26/21 1430 09/26/21 1601  BP: 135/66 135/66  Pulse: 65 65  Resp:  18  Temp:  97.8 F (36.6 C)  SpO2:  96%     General: Awake, no distress.  CV:  Good peripheral perfusion.  Resp:  Normal effort.  CTA bilaterally Abd:  No distention.  Soft, nontender Other:     ED Results / Procedures / Treatments   Labs (all labs ordered are listed, but only abnormal results are displayed) Labs Reviewed  LIPASE, BLOOD - Abnormal; Notable for the following components:      Result Value   Lipase 53 (*)    All other components within normal limits  COMPREHENSIVE METABOLIC PANEL - Abnormal; Notable for the following components:   Potassium 3.3 (*)    Chloride 96 (*)    BUN 35 (*)    Creatinine, Ser 1.12 (*)    Total Bilirubin 1.5 (*)    GFR, Estimated 49 (*)     All other components within normal limits  CBC  URINALYSIS, ROUTINE W REFLEX MICROSCOPIC  TSH     EKG     RADIOLOGY Chest x-ray viewed and interpreted by me, no acute abnormality    PROCEDURES:  Critical Care performed:   Procedures   MEDICATIONS ORDERED IN ED: Medications  0.9 %  sodium chloride infusion (0 mLs Intravenous Stopped 09/26/21 1600)  ondansetron (ZOFRAN) injection 4 mg (4 mg Intravenous Given 09/26/21 1437)     IMPRESSION / MDM / ASSESSMENT AND PLAN / ED COURSE  I reviewed the triage vital signs and the nursing notes. Patient's presentation is most consistent with acute illness / injury with system symptoms.   Patient presents with nausea, decreased appetite, weight loss as detailed above.  Differential includes GERD/gastritis, PUD, undiagnosed malignancy, electrolyte abnormality.    She has had a CT scan as well as a colonoscopy which has not shown any concerning masses, will obtain  chest x-ray here although no smoking history  Lab work today is overall reassuring, mild elevation of BUN/creatinine ratio consistent with mild dehydration, will give IV fluids, IV Zofran  CBC is normal, glucose is normal  Reviewed old records, patient's colonoscopy performed on 4 6 demonstrated only 1 polyp  ----------------------------------------- 4:03 PM on 09/26/2021 ----------------------------------------- Patient reevaluated after IV fluids, IV Zofran, she reports she is feeling much improved, she would like to be discharged, considered admission however given improved status and good outpatient follow-up appropriate for discharge at this time       FINAL CLINICAL IMPRESSION(S) / ED DIAGNOSES   Final diagnoses:  Dehydration     Rx / DC Orders   ED Discharge Orders     None        Note:  This document was prepared using Dragon voice recognition software and may include unintentional dictation errors.   Lavonia Drafts, MD 09/26/21 [phone]

## 2021-09-26 NOTE — ED Notes (Signed)
Patient transported to X-ray 

## 2021-09-26 NOTE — ED Triage Notes (Signed)
Pt comes pov with n/v for 3 days. No diarrhea. No abd pain, just nausea.

## 2021-10-07 ENCOUNTER — Emergency Department: Payer: Medicare Other

## 2021-10-07 ENCOUNTER — Emergency Department
Admission: EM | Admit: 2021-10-07 | Discharge: 2021-10-07 | Disposition: A | Discharge status: Home / Self Care | Payer: Medicare Other | Attending: Emergency Medicine | Admitting: Emergency Medicine

## 2021-10-07 ENCOUNTER — Other Ambulatory Visit: Payer: Self-pay

## 2021-10-07 DIAGNOSIS — F0390 Unspecified dementia without behavioral disturbance: Secondary | ICD-10-CM | POA: Insufficient documentation

## 2021-10-07 DIAGNOSIS — Z8541 Personal history of malignant neoplasm of cervix uteri: Secondary | ICD-10-CM | POA: Insufficient documentation

## 2021-10-07 DIAGNOSIS — W19XXXA Unspecified fall, initial encounter: Secondary | ICD-10-CM | POA: Diagnosis not present

## 2021-10-07 DIAGNOSIS — S2232XA Fracture of one rib, left side, initial encounter for closed fracture: Secondary | ICD-10-CM | POA: Insufficient documentation

## 2021-10-07 DIAGNOSIS — S299XXA Unspecified injury of thorax, initial encounter: Secondary | ICD-10-CM | POA: Diagnosis present

## 2021-10-07 MED ORDER — LIDOCAINE 5 % EX PTCH: 1.0000 | MEDICATED_PATCH | Freq: Two times a day (BID) | CUTANEOUS | 11 refills | Status: AC

## 2021-10-07 MED ORDER — HYDROCODONE-ACETAMINOPHEN 5-325 MG PO TABS: 1.0000 | ORAL_TABLET | Freq: Four times a day (QID) | ORAL | 0 refills | Status: AC | PRN

## 2021-10-07 MED ORDER — HYDROCODONE-ACETAMINOPHEN 5-325 MG PO TABS
1.0000 | ORAL_TABLET | Freq: Once | ORAL | Status: AC
  Administered 2021-10-07: 1 via ORAL
  Filled 2021-10-07: qty 1

## 2021-10-07 NOTE — Discharge Instructions (Addendum)
-  You may use lidocaine patches as needed for pain.  Use hydrocodone/acetaminophen sparingly and only as needed.  -It is important that you continue to take deep breaths throughout your recovery to prevent pneumonia.  You may take up to 3 to 5 weeks for full recovery.  Minimize use of the left arm to avoid worsening your injury.  -Follow-up with your primary care provider within the next week to ensure improvement in symptoms.  -Return to the emergency department anytime if you begin to experience any new or worsening symptoms

## 2021-10-07 NOTE — ED Notes (Signed)
40 yof with a c/c of left sided upper rib pain since last Thursday due to a fall.

## 2021-10-07 NOTE — ED Provider Notes (Signed)
[facility] Provider Note    None    (approximate)   History   Chief Complaint Rib Injury   HPI Laurie Long is a 81 y.o. female, history of anxiety, GERD, depression, dementia, endometrial cancer, presents to the emergency department for evaluation of rib injury.  She states that she slid off of her car approximately 1 week ago.  She was helped up by her son, and believes that she may have injured her rib when he was picking her up.  She states that over the past week, the ribs on her left side of been bothering her.  Reports difficulty taking a deep breath due to the pain.  Denies fever/chills, cough/congestion, shortness of breath, abdominal pain, flank pain, nausea/vomiting, dizziness/lightheadedness, or numbness/tingling in upper or lower extremities.  History Limitations: No limitations.        Physical Exam  Triage Vital Signs: ED Triage Vitals  Enc Vitals Group     BP 10/07/21 1034 131/76     Pulse Rate 10/07/21 1034 85     Resp 10/07/21 1034 17     Temp 10/07/21 1034 98 F (36.7 C)     Temp Source 10/07/21 1034 Oral     SpO2 10/07/21 1034 97 %     Weight 10/07/21 1035 105 lb (47.6 kg)     Height 10/07/21 1035 5' (1.524 m)     Head Circumference --      Peak Flow --      Pain Score --      Pain Loc --      Pain Edu? --      Excl. in [place]? --     Most recent vital signs: Vitals:   10/07/21 1034  BP: 131/76  Pulse: 85  Resp: 17  Temp: 98 F (36.7 C)  SpO2: 97%    General: Awake, NAD.  Skin: Warm, dry. No rashes or lesions.  Eyes: PERRL. Conjunctivae normal.  CV: Good peripheral perfusion.  Resp: Normal effort.  Abd: Soft, non-tender. No distention.  Neuro: At baseline. No gross neurological deficits.   Focused Exam: Tenderness appreciated on the left side of the chest wall, particular around ribs 4 and 5.  No surrounding erythema or ecchymosis.  No gross deformities.  Physical Exam    ED Results / Procedures /  Treatments  Labs (all labs ordered are listed, but only abnormal results are displayed) Labs Reviewed - No data to display   EKG N/A.   RADIOLOGY  ED Provider Interpretation: I personally viewed and interpreted this chest x-ray, minimally displaced fracture present along the left fifth rib.  DG Ribs Unilateral W/Chest Left  Result Date: 10/07/2021 CLINICAL DATA:  Rib injury, LEFT rib pain post fall last Thursday injuring LEFT ribs, hurts to move in take a deep breath EXAM: LEFT RIBS AND CHEST - 3+ VIEW COMPARISON:  Chest radiographs 09/26/2021 FINDINGS: Normal heart size, mediastinal contours, and pulmonary vascularity. Mild chronic bronchitic changes. No pulmonary infiltrate, pleural effusion, or pneumothorax. Bones appear demineralized. BB placed at site of symptoms lower LEFT ribs. Minimally displaced fracture at anterior aspect of LEFT fifth rib. No additional fractures identified. IMPRESSION: Minimally displaced fracture at anterior LEFT fifth rib. Mild chronic bronchitic changes. Electronically Signed   By: Lavonia Dana M.D.   On: 10/07/2021 11:22    PROCEDURES:  Critical Care performed: N/A.  Procedures    MEDICATIONS ORDERED IN ED: Medications  HYDROcodone-acetaminophen (NORCO/VICODIN) 5-325 MG per tablet 1 tablet (1 tablet  Oral Given 10/07/21 1159)     IMPRESSION / MDM / ASSESSMENT AND PLAN / ED COURSE  I reviewed the triage vital signs and the nursing notes.                              Differential diagnosis includes, but is not limited to, rib fracture, rib contusion, pneumothorax, pneumonia.  Patient appears well, vitals within normal limits.  NAD.  Currently nursing on moderate pain.  We will go ahead treat with hydrocodone/acetaminophen.  Assessment/Plan Patient presents with left-sided rib pain secondary to mechanical fall last week.  Denies any preceding symptoms prior to the fall or head injury.  Chest x-ray shows minimally displaced left fifth rib fracture.   Patient clinically appears well.  Pain controlled here with single dose of hydrocodone/acetaminophen.  Will provide patient with short-term prescription for pain medication to take as she recovers.  Recommend that she follow-up with her primary care provider within the next week to ensure improvement in symptoms.  We will plan to discharge.  Patient's presentation is most consistent with acute complicated illness / injury requiring diagnostic workup.   Considered admission for this patient, but given her stable presentation and minimal injury, she is unlikely to benefit from admission.  Provided the patient with anticipatory guidance, return precautions, and educational material. Encouraged the patient to return to the emergency department at any time if they begin to experience any new or worsening symptoms. Patient expressed understanding and agreed with the plan.       FINAL CLINICAL IMPRESSION(S) / ED DIAGNOSES   Final diagnoses:  Closed fracture of one rib of left side, initial encounter     Rx / DC Orders   ED Discharge Orders          Ordered    lidocaine (LIDODERM) 5 %  Every 12 hours        10/07/21 1204    HYDROcodone-acetaminophen (NORCO/VICODIN) 5-325 MG tablet  Every 6 hours PRN        10/07/21 1204             Note:  This document was prepared using Dragon voice recognition software and may include unintentional dictation errors.   Teodoro Spray, [place] 10/07/21 2041    Vanessa Bradley, MD 10/09/21 [phone]

## 2021-10-07 NOTE — ED Triage Notes (Signed)
Pt fell last Thursday and injured her left ribs , hurts to move or take a deep breath

## 2021-11-03 ENCOUNTER — Other Ambulatory Visit: Payer: Self-pay | Admitting: Neurology

## 2021-11-03 DIAGNOSIS — R4189 Other symptoms and signs involving cognitive functions and awareness: Secondary | ICD-10-CM

## 2021-11-18 ENCOUNTER — Ambulatory Visit: Admission: RE | Admit: 2021-11-18 | Payer: Medicare Other | Source: Ambulatory Visit

## 2021-12-02 ENCOUNTER — Ambulatory Visit
Admission: RE | Admit: 2021-12-02 | Discharge: 2021-12-02 | Disposition: A | Discharge status: Home / Self Care | Payer: Medicare Other | Source: Ambulatory Visit | Attending: Neurology | Admitting: Neurology

## 2021-12-02 DIAGNOSIS — R4189 Other symptoms and signs involving cognitive functions and awareness: Secondary | ICD-10-CM | POA: Insufficient documentation

## 2022-10-20 ENCOUNTER — Other Ambulatory Visit: Payer: Self-pay | Admitting: Internal Medicine

## 2022-10-20 DIAGNOSIS — Z1231 Encounter for screening mammogram for malignant neoplasm of breast: Secondary | ICD-10-CM

## 2023-01-14 IMAGING — DX DG ABDOMEN 1V
1 series · 1 of 1 positions shown · non-contrast
Comparison: None Available.

CLINICAL DATA: Constipation.

EXAM:
ABDOMEN - 1 VIEW

[abdomen supine]
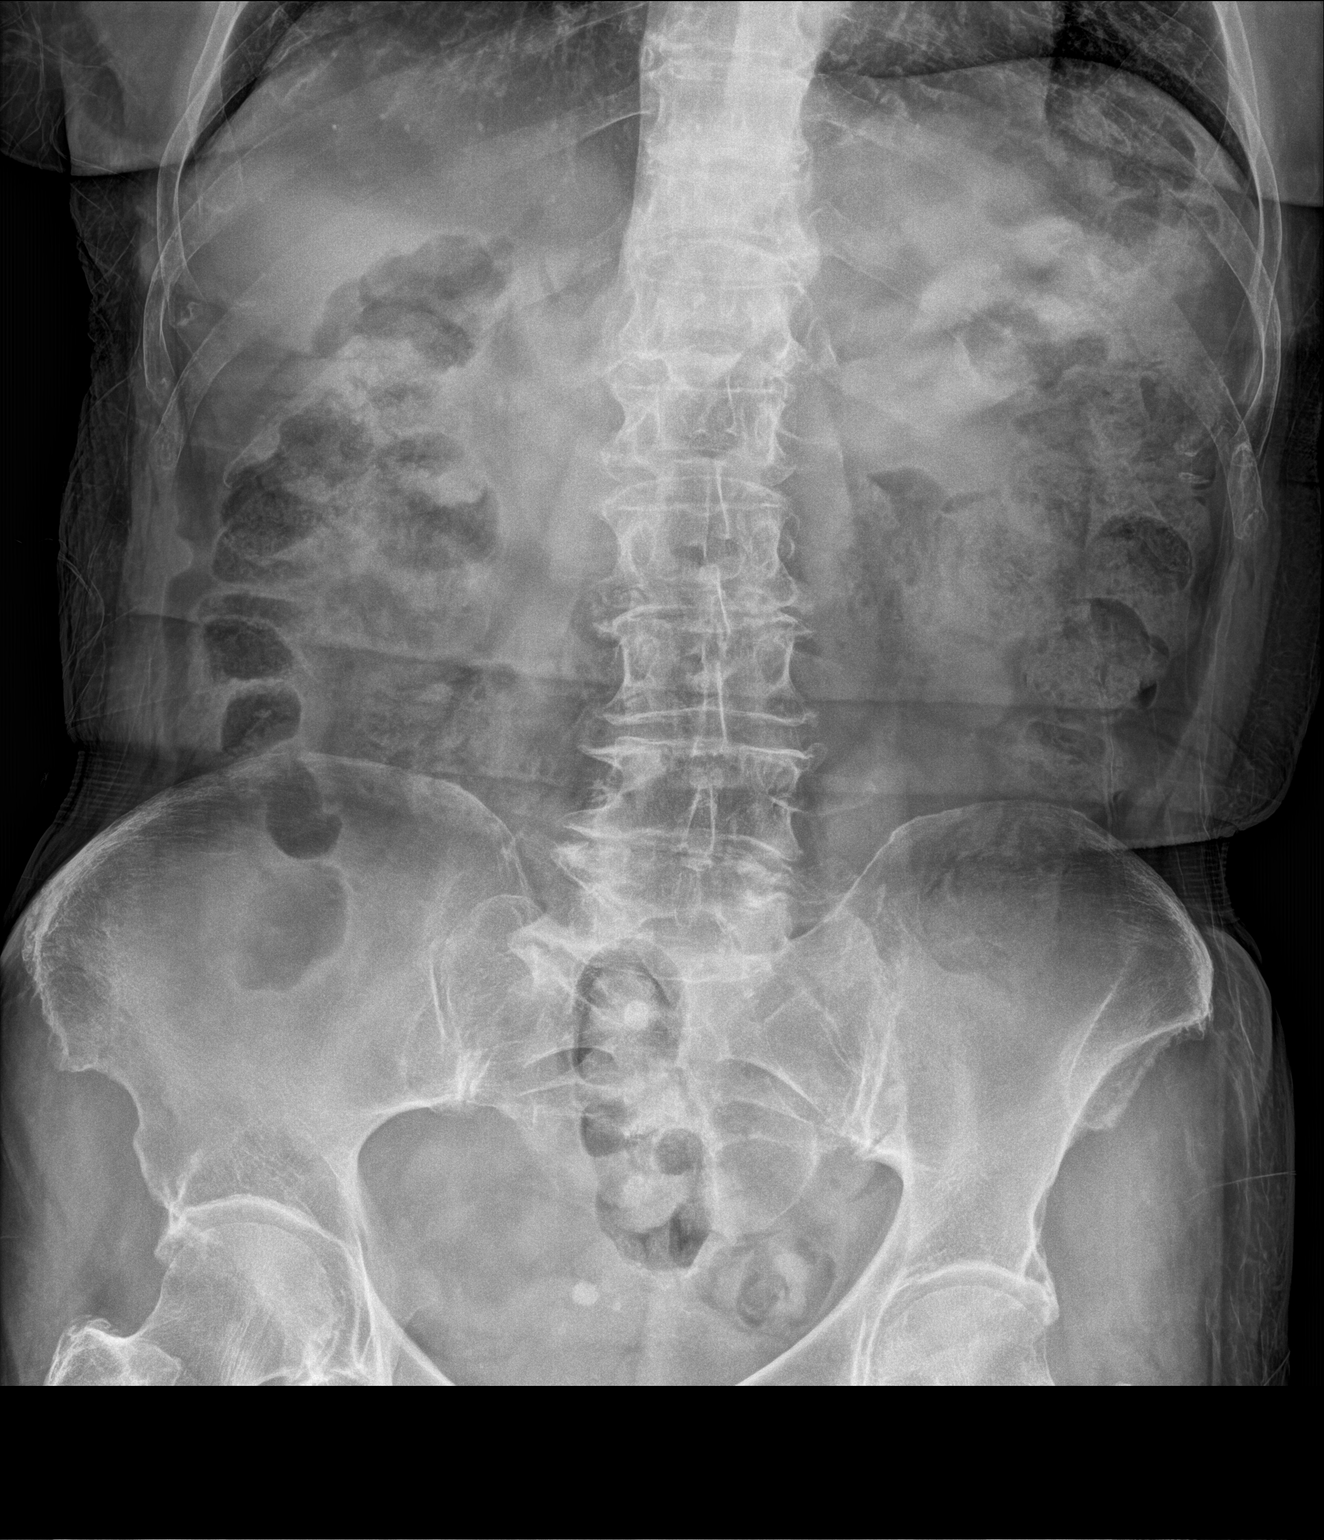

[1 of 1 positions shown; findings below may reference images not displayed]

FINDINGS: No abnormal bowel dilatation is noted. Moderate amount of stool seen
throughout the colon. Phleboliths are noted in the pelvis.
IMPRESSION: Moderate stool burden.  No abnormal bowel dilatation.

## 2023-11-30 ENCOUNTER — Other Ambulatory Visit: Payer: Self-pay | Admitting: Emergency Medicine

## 2023-11-30 DIAGNOSIS — R053 Chronic cough: Secondary | ICD-10-CM

## 2023-12-13 ENCOUNTER — Ambulatory Visit
Admission: RE | Admit: 2023-12-13 | Discharge: 2023-12-13 | Disposition: A | Discharge status: Home / Self Care | Source: Ambulatory Visit | Attending: Emergency Medicine | Admitting: Emergency Medicine

## 2023-12-13 DIAGNOSIS — R053 Chronic cough: Secondary | ICD-10-CM | POA: Insufficient documentation

## 2023-12-26 ENCOUNTER — Other Ambulatory Visit: Payer: Self-pay | Admitting: Internal Medicine

## 2023-12-26 DIAGNOSIS — Z1231 Encounter for screening mammogram for malignant neoplasm of breast: Secondary | ICD-10-CM

## 2024-01-30 ENCOUNTER — Ambulatory Visit
Admission: RE | Admit: 2024-01-30 | Discharge: 2024-01-30 | Disposition: A | Discharge status: Home / Self Care | Source: Ambulatory Visit | Attending: Internal Medicine | Admitting: Internal Medicine

## 2024-01-30 ENCOUNTER — Other Ambulatory Visit: Payer: Self-pay | Admitting: Emergency Medicine

## 2024-01-30 DIAGNOSIS — Z1231 Encounter for screening mammogram for malignant neoplasm of breast: Secondary | ICD-10-CM | POA: Insufficient documentation

## 2024-01-30 DIAGNOSIS — R1310 Dysphagia, unspecified: Secondary | ICD-10-CM

## 2024-02-01 ENCOUNTER — Ambulatory Visit
Admission: RE | Admit: 2024-02-01 | Discharge: 2024-02-01 | Disposition: A | Discharge status: Home / Self Care | Source: Ambulatory Visit | Attending: Emergency Medicine | Admitting: Emergency Medicine

## 2024-02-01 DIAGNOSIS — R1310 Dysphagia, unspecified: Secondary | ICD-10-CM | POA: Diagnosis present

## 2024-02-01 NOTE — Procedures (Signed)
 Modified Barium Swallow Study  Patient Details  Name: Laurie Long MRN: [id number] Date of Birth: December 05, 1940  Today's Date: 02/01/2024  Modified Barium Swallow completed.  Full report located under Chart Review in the Imaging Section.  History of Present Illness Pt is an 83 y.o. female who present for MBSS due to complaints of persistent cough in setting of dementia. Pt with PMHx asthma, allergic rhinitis, anxiety, GERD, HTN, and dementia.  CT chest 12/13/23, 1. No acute intrathoracic pathology.  2. Coronary vascular calcification.  3.  Aortic Atherosclerosis (ICD10-I70.0).   Clinical Impression Pt presents with oropharyngeal swallow function judged to be [facility] for age. Age-related swallowing changes appreciated including trace oral stasis, swallow initiation at the level of the pyriform sinus, and x1 instance of transient laryngeal penetration with sequential sips of thin liquids. Of note, cough appreciated during study without clear relationship to oropharyngeal or pharyngoesophageal swallowing. Recommend continuation of a regular diet and thin liquids with standard aspiration precautions. No f/u ST services warranted. Factors that may increase risk of adverse event in presence of aspiration Noe & Lianne 2021): Reduced cognitive function  Swallow Evaluation Recommendations Recommendations: PO diet PO Diet Recommendation: Regular;Thin liquids (Level 0) Liquid Administration via: Spoon;Cup;Straw Medication Administration:  (as tolerated) Supervision: Patient able to self-feed Swallowing strategies  : Slow rate;Small bites/sips Postural changes: Position pt fully upright for meals;Stay upright 30-60 min after meals Oral care recommendations: Oral care BID (2x/day)      Delon HERO Karry Causer 02/01/2024,1:28 PM
